# Patient Record
Sex: Female | Born: 1955
Health system: Southern US, Community
[De-identification: ages and names within clinical notes are randomized; demographics above are authoritative.]

## PROBLEM LIST (undated history)

## (undated) DIAGNOSIS — E7800 Pure hypercholesterolemia, unspecified: Secondary | ICD-10-CM

## (undated) DIAGNOSIS — I10 Essential (primary) hypertension: Secondary | ICD-10-CM

## (undated) HISTORY — PX: CHOLECYSTECTOMY: SHX55

## (undated) HISTORY — PX: ROTATOR CUFF REPAIR: SHX139

## (undated) HISTORY — PX: EYE SURGERY: SHX253

## (undated) HISTORY — PX: TONSILLECTOMY: SUR1361

## (undated) HISTORY — DX: Pure hypercholesterolemia, unspecified: E78.00

## (undated) HISTORY — DX: Essential (primary) hypertension: I10

## (undated) HISTORY — PX: BREAST SURGERY: SHX581

---

## 1999-02-14 ENCOUNTER — Encounter: Payer: Self-pay | Admitting: Gynecology

## 1999-02-14 ENCOUNTER — Encounter: Admission: RE | Admit: 1999-02-14 | Discharge: 1999-02-14 | Payer: Self-pay | Admitting: Gynecology

## 2000-04-03 ENCOUNTER — Other Ambulatory Visit: Admission: RE | Admit: 2000-04-03 | Discharge: 2000-04-03 | Payer: Self-pay | Admitting: Gynecology

## 2000-04-04 ENCOUNTER — Encounter: Payer: Self-pay | Admitting: Gynecology

## 2000-04-04 ENCOUNTER — Encounter: Admission: RE | Admit: 2000-04-04 | Discharge: 2000-04-04 | Payer: Self-pay | Admitting: Gynecology

## 2000-12-10 ENCOUNTER — Ambulatory Visit (HOSPITAL_COMMUNITY): Admission: RE | Admit: 2000-12-10 | Discharge: 2000-12-10 | Payer: Self-pay | Admitting: Orthopedic Surgery

## 2000-12-14 ENCOUNTER — Ambulatory Visit (HOSPITAL_COMMUNITY): Admission: RE | Admit: 2000-12-14 | Discharge: 2000-12-14 | Payer: Self-pay | Admitting: Orthopedic Surgery

## 2000-12-14 ENCOUNTER — Encounter: Payer: Self-pay | Admitting: Orthopedic Surgery

## 2001-04-23 ENCOUNTER — Encounter: Payer: Self-pay | Admitting: Gynecology

## 2001-04-23 ENCOUNTER — Encounter: Admission: RE | Admit: 2001-04-23 | Discharge: 2001-04-23 | Payer: Self-pay | Admitting: Gynecology

## 2001-04-24 ENCOUNTER — Other Ambulatory Visit: Admission: RE | Admit: 2001-04-24 | Discharge: 2001-04-24 | Payer: Self-pay | Admitting: Gynecology

## 2002-04-24 ENCOUNTER — Encounter: Payer: Self-pay | Admitting: Gynecology

## 2002-04-24 ENCOUNTER — Encounter: Admission: RE | Admit: 2002-04-24 | Discharge: 2002-04-24 | Payer: Self-pay | Admitting: Gynecology

## 2002-05-05 ENCOUNTER — Other Ambulatory Visit: Admission: RE | Admit: 2002-05-05 | Discharge: 2002-05-05 | Payer: Self-pay | Admitting: Gynecology

## 2003-06-12 ENCOUNTER — Encounter: Admission: RE | Admit: 2003-06-12 | Discharge: 2003-06-12 | Payer: Self-pay | Admitting: Gynecology

## 2003-10-02 ENCOUNTER — Emergency Department (HOSPITAL_COMMUNITY): Admission: EM | Admit: 2003-10-02 | Discharge: 2003-10-02 | Payer: Self-pay | Admitting: Family Medicine

## 2004-03-18 ENCOUNTER — Ambulatory Visit (HOSPITAL_COMMUNITY): Admission: RE | Admit: 2004-03-18 | Discharge: 2004-03-18 | Payer: Self-pay | Admitting: *Deleted

## 2005-06-20 ENCOUNTER — Encounter: Admission: RE | Admit: 2005-06-20 | Discharge: 2005-06-20 | Payer: Self-pay | Admitting: Gynecology

## 2005-06-21 ENCOUNTER — Other Ambulatory Visit: Admission: RE | Admit: 2005-06-21 | Discharge: 2005-06-21 | Payer: Self-pay | Admitting: Gynecology

## 2006-08-28 ENCOUNTER — Other Ambulatory Visit: Admission: RE | Admit: 2006-08-28 | Discharge: 2006-08-28 | Payer: Self-pay | Admitting: Gynecology

## 2006-09-05 ENCOUNTER — Encounter: Admission: RE | Admit: 2006-09-05 | Discharge: 2006-09-05 | Payer: Self-pay | Admitting: Gynecology

## 2007-12-03 ENCOUNTER — Encounter: Admission: RE | Admit: 2007-12-03 | Discharge: 2007-12-03 | Payer: Self-pay | Admitting: Gynecology

## 2009-01-26 ENCOUNTER — Encounter: Admission: RE | Admit: 2009-01-26 | Discharge: 2009-01-26 | Payer: Self-pay | Admitting: Gynecology

## 2010-04-01 ENCOUNTER — Other Ambulatory Visit: Payer: Self-pay | Admitting: Gynecology

## 2010-04-01 DIAGNOSIS — Z1239 Encounter for other screening for malignant neoplasm of breast: Secondary | ICD-10-CM

## 2010-04-25 ENCOUNTER — Ambulatory Visit
Admission: RE | Admit: 2010-04-25 | Discharge: 2010-04-25 | Disposition: A | Discharge status: Home / Self Care | Payer: Managed Care, Other (non HMO) | Source: Ambulatory Visit | Attending: Gynecology | Admitting: Gynecology

## 2010-04-25 DIAGNOSIS — Z1239 Encounter for other screening for malignant neoplasm of breast: Secondary | ICD-10-CM

## 2011-05-18 ENCOUNTER — Other Ambulatory Visit: Payer: Self-pay | Admitting: Gynecology

## 2011-05-18 DIAGNOSIS — Z1231 Encounter for screening mammogram for malignant neoplasm of breast: Secondary | ICD-10-CM

## 2011-05-30 ENCOUNTER — Ambulatory Visit
Admission: RE | Admit: 2011-05-30 | Discharge: 2011-05-30 | Disposition: A | Discharge status: Home / Self Care | Payer: 59 | Source: Ambulatory Visit | Attending: Gynecology | Admitting: Gynecology

## 2011-05-30 DIAGNOSIS — Z1231 Encounter for screening mammogram for malignant neoplasm of breast: Secondary | ICD-10-CM

## 2011-06-01 ENCOUNTER — Other Ambulatory Visit: Payer: Self-pay | Admitting: Gynecology

## 2011-06-01 DIAGNOSIS — R928 Other abnormal and inconclusive findings on diagnostic imaging of breast: Secondary | ICD-10-CM

## 2011-06-06 ENCOUNTER — Ambulatory Visit
Admission: RE | Admit: 2011-06-06 | Discharge: 2011-06-06 | Disposition: A | Discharge status: Home / Self Care | Payer: 59 | Source: Ambulatory Visit | Attending: Gynecology | Admitting: Gynecology

## 2011-06-06 ENCOUNTER — Other Ambulatory Visit: Payer: Self-pay | Admitting: Gynecology

## 2011-06-06 DIAGNOSIS — R928 Other abnormal and inconclusive findings on diagnostic imaging of breast: Secondary | ICD-10-CM

## 2011-06-12 ENCOUNTER — Ambulatory Visit
Admission: RE | Admit: 2011-06-12 | Discharge: 2011-06-12 | Disposition: A | Discharge status: Home / Self Care | Payer: 59 | Source: Ambulatory Visit | Attending: Gynecology | Admitting: Gynecology

## 2011-06-12 ENCOUNTER — Other Ambulatory Visit: Payer: Self-pay | Admitting: Gynecology

## 2011-06-12 DIAGNOSIS — R928 Other abnormal and inconclusive findings on diagnostic imaging of breast: Secondary | ICD-10-CM

## 2011-06-12 HISTORY — PX: BREAST BIOPSY: SHX20

## 2011-06-12 HISTORY — PX: BREAST CYST ASPIRATION: SHX578

## 2011-11-10 ENCOUNTER — Other Ambulatory Visit: Payer: Self-pay | Admitting: Gynecology

## 2011-11-10 DIAGNOSIS — N6320 Unspecified lump in the left breast, unspecified quadrant: Secondary | ICD-10-CM

## 2011-12-18 ENCOUNTER — Ambulatory Visit
Admission: RE | Admit: 2011-12-18 | Discharge: 2011-12-18 | Disposition: A | Discharge status: Home / Self Care | Payer: 59 | Source: Ambulatory Visit | Attending: Gynecology | Admitting: Gynecology

## 2011-12-18 DIAGNOSIS — N6320 Unspecified lump in the left breast, unspecified quadrant: Secondary | ICD-10-CM

## 2012-06-25 ENCOUNTER — Other Ambulatory Visit: Payer: Self-pay

## 2012-06-25 DIAGNOSIS — Z1231 Encounter for screening mammogram for malignant neoplasm of breast: Secondary | ICD-10-CM

## 2012-06-27 ENCOUNTER — Ambulatory Visit
Admission: RE | Admit: 2012-06-27 | Discharge: 2012-06-27 | Disposition: A | Discharge status: Home / Self Care | Payer: 59 | Source: Ambulatory Visit

## 2012-06-27 DIAGNOSIS — Z1231 Encounter for screening mammogram for malignant neoplasm of breast: Secondary | ICD-10-CM

## 2012-10-07 ENCOUNTER — Ambulatory Visit (INDEPENDENT_AMBULATORY_CARE_PROVIDER_SITE_OTHER): Payer: 59 | Admitting: Gynecology

## 2012-10-07 ENCOUNTER — Encounter: Payer: Self-pay | Admitting: Gynecology

## 2012-10-07 VITALS — BP 140/84 | Ht 65.0 in | Wt 185.0 lb

## 2012-10-07 DIAGNOSIS — Z01419 Encounter for gynecological examination (general) (routine) without abnormal findings: Secondary | ICD-10-CM

## 2012-10-07 DIAGNOSIS — Z79890 Hormone replacement therapy (postmenopausal): Secondary | ICD-10-CM

## 2012-10-07 MED ORDER — PROGESTERONE MICRONIZED 100 MG PO CAPS: 100.0000 mg | ORAL_CAPSULE | Freq: Every day | ORAL | Status: DC

## 2012-10-07 MED ORDER — ESTRADIOL 0.05 MG/24HR TD PTTW: 1.0000 | MEDICATED_PATCH | TRANSDERMAL | Status: DC

## 2012-10-07 NOTE — Progress Notes (Addendum)
Daisy Campbell Apr 13, 1955 [id number]        57 y.o.  [id number] new patient for annual exam.  Former patient of Dr. Nicholas Lose. Several issues noted below.  Past medical history,surgical history, medications, allergies, family history and social history were all reviewed and documented in the EPIC chart.  ROS:  Performed and pertinent positives and negatives are included in the history, assessment and plan .  Exam: [facility] assistant Filed Vitals:   10/07/12 1004  BP: 140/84  Height: 5\' 5"  (1.651 m)  Weight: 185 lb (83.915 kg)   General appearance  Normal Skin grossly normal Head/Neck normal with no cervical or supraclavicular adenopathy thyroid normal Lungs  clear Cardiac RR, without RMG Abdominal  soft, nontender, without masses, organomegaly or hernia Breasts  examined lying and sitting without masses, retractions, discharge or axillary adenopathy. Pelvic  Ext/BUS/vagina  normal with small sebaceous cyst right posterior fourchette hymenal ring area  Cervix  normal   Uterus  anteverted, normal size, shape and contour, midline and mobile nontender   Adnexa  Without masses or tenderness    Anus and perineum  normal   Rectovaginal  normal sphincter tone without palpated masses or tenderness.    Assessment/Plan:  57 y.o. [name] female for annual exam.   1. HRT. Patient on Minivelle 0.5 mg patches and Prometrium 200 mg every 3 months for 12 days. Is having a withdrawal bleed at the end of the 12 days. Started due mostly to emotional swings and has done well since initiation.  I reviewed the whole issue of HRT with her to include the WHI study with increased risk of stroke, heart attack, DVT and breast cancer. The ACOG and NAMS statements for lowest dose for the shortest period of time reviewed. Transdermal versus oral first-pass effect benefit discussed. Patient wants to continue. I discussed switching to Prometrium 100 mg nightly to avoid withdrawal bleeding and remembering to take the  progesterone. Patient agrees with this. I refilled both times one year. Patient knows to report any bleeding. 2. Small subcutaneous nodule right lateral posterior fourchette. Consistent with small sebaceous cyst. Patient notes been present on and off for years and is not bothersome to her. She will continue to observe. If enlarges or changes she knows to represent for evaluation. 3. History of hypertension and hypercholesterolemia. Being treated by Dr. Nicholas Lose. She actually is setting up an appointment with a primary care to follow her for these and she will followup with them. Mild elevated blood pressure today noted to her. 4. Pap smear 2013. No Pap smear done today. No history of abnormal Pap smears previously. Reviewed current screening guidelines and plan repeat at 3 year interval. 5. Mammography 06/2012. Continue with annual mammography. SBE monthly reviewed. 6. Colonoscopy 7 years ago. Planned repeat at 10 year interval. 7. DEXA 2012 reported normal. Will obtain copy. Recommend repeat at 5 year interval. Increase calcium vitamin D reviewed. 8. Health maintenance. Follow up with primary as noted above. Followup here in one year, sooner as needed.  Note: This document was prepared with digital dictation and possible smart phrase technology. Any transcriptional errors that result from this process are unintentional.   [name] MD, 10:54 AM 10/07/2012

## 2012-10-07 NOTE — Patient Instructions (Signed)
Continue to use the estrogen patch. Start on the progesterone pills nightly. Call if any bleeding or other issues.

## 2012-10-08 LAB — URINALYSIS W MICROSCOPIC + REFLEX CULTURE
Bacteria, UA: NONE SEEN
Casts: NONE SEEN
Crystals: NONE SEEN
Nitrite: NEGATIVE
Specific Gravity, Urine: 1.008 (ref 1.005–1.030)
Squamous Epithelial / LPF: NONE SEEN
Urobilinogen, UA: 0.2 mg/dL (ref 0.0–1.0)

## 2012-10-30 ENCOUNTER — Encounter: Payer: Self-pay | Admitting: Gynecology

## 2013-08-12 ENCOUNTER — Other Ambulatory Visit: Payer: Self-pay

## 2013-08-12 DIAGNOSIS — Z1231 Encounter for screening mammogram for malignant neoplasm of breast: Secondary | ICD-10-CM

## 2013-08-14 ENCOUNTER — Ambulatory Visit
Admission: RE | Admit: 2013-08-14 | Discharge: 2013-08-14 | Disposition: A | Discharge status: Home / Self Care | Payer: 59 | Source: Ambulatory Visit

## 2013-08-14 ENCOUNTER — Encounter (INDEPENDENT_AMBULATORY_CARE_PROVIDER_SITE_OTHER): Payer: Self-pay

## 2013-08-14 DIAGNOSIS — Z1231 Encounter for screening mammogram for malignant neoplasm of breast: Secondary | ICD-10-CM

## 2013-10-03 ENCOUNTER — Other Ambulatory Visit: Payer: Self-pay | Admitting: Gynecology

## 2013-10-29 ENCOUNTER — Other Ambulatory Visit: Payer: Self-pay | Admitting: Gynecology

## 2013-11-17 ENCOUNTER — Ambulatory Visit (INDEPENDENT_AMBULATORY_CARE_PROVIDER_SITE_OTHER): Payer: 59 | Admitting: Gynecology

## 2013-11-17 ENCOUNTER — Encounter: Payer: Self-pay | Admitting: Gynecology

## 2013-11-17 VITALS — BP 130/80 | Ht 65.0 in | Wt 199.0 lb

## 2013-11-17 DIAGNOSIS — Z79890 Hormone replacement therapy (postmenopausal): Secondary | ICD-10-CM

## 2013-11-17 DIAGNOSIS — Z01419 Encounter for gynecological examination (general) (routine) without abnormal findings: Secondary | ICD-10-CM

## 2013-11-17 DIAGNOSIS — N952 Postmenopausal atrophic vaginitis: Secondary | ICD-10-CM

## 2013-11-17 MED ORDER — PROGESTERONE MICRONIZED 100 MG PO CAPS: ORAL_CAPSULE | ORAL | Status: DC

## 2013-11-17 MED ORDER — ESTRADIOL 0.05 MG/24HR TD PTTW: MEDICATED_PATCH | TRANSDERMAL | Status: DC

## 2013-11-17 NOTE — Patient Instructions (Signed)
You may obtain a copy of any labs that were done today by logging onto MyChart as outlined in the instructions provided with your AVS (after visit summary). The office will not call with normal lab results but certainly if there are any significant abnormalities then we will contact you.   Health Maintenance, Female A healthy lifestyle and preventative care can promote health and wellness.  Maintain regular health, dental, and eye exams.  Eat a healthy diet. Foods like vegetables, fruits, whole grains, low-fat dairy products, and lean protein foods contain the nutrients you need without too many calories. Decrease your intake of foods high in solid fats, added sugars, and salt. Get information about a proper diet from your caregiver, if necessary.  Regular physical exercise is one of the most important things you can do for your health. Most adults should get at least 150 minutes of moderate-intensity exercise (any activity that increases your heart rate and causes you to sweat) each week. In addition, most adults need muscle-strengthening exercises on 2 or more days a week.   Maintain a healthy weight. The body mass index (BMI) is a screening tool to identify possible weight problems. It provides an estimate of body fat based on height and weight. Your caregiver can help determine your BMI, and can help you achieve or maintain a healthy weight. For adults 20 years and older:  A BMI below 18.5 is considered underweight.  A BMI of 18.5 to 24.9 is normal.  A BMI of 25 to 29.9 is considered overweight.  A BMI of 30 and above is considered obese.  Maintain normal blood lipids and cholesterol by exercising and minimizing your intake of saturated fat. Eat a balanced diet with plenty of fruits and vegetables. Blood tests for lipids and cholesterol should begin at age 61 and be repeated every 5 years. If your lipid or cholesterol levels are high, you are over 50, or you are a high risk for heart  disease, you may need your cholesterol levels checked more frequently.Ongoing high lipid and cholesterol levels should be treated with medicines if diet and exercise are not effective.  If you smoke, find out from your caregiver how to quit. If you do not use tobacco, do not start.  Lung cancer screening is recommended for adults aged 33 80 years who are at high risk for developing lung cancer because of a history of smoking. Yearly low-dose computed tomography (CT) is recommended for people who have at least a 30-pack-year history of smoking and are a current smoker or have quit within the past 15 years. A pack year of smoking is smoking an average of 1 pack of cigarettes a day for 1 year (for example: 1 pack a day for 30 years or 2 packs a day for 15 years). Yearly screening should continue until the smoker has stopped smoking for at least 15 years. Yearly screening should also be stopped for people who develop a health problem that would prevent them from having lung cancer treatment.  If you are pregnant, do not drink alcohol. If you are breastfeeding, be very cautious about drinking alcohol. If you are not pregnant and choose to drink alcohol, do not exceed 1 drink per day. One drink is considered to be 12 ounces (355 mL) of beer, 5 ounces (148 mL) of wine, or 1.5 ounces (44 mL) of liquor.  Avoid use of street drugs. Do not share needles with anyone. Ask for help if you need support or instructions about stopping  the use of drugs.  High blood pressure causes heart disease and increases the risk of stroke. Blood pressure should be checked at least every 1 to 2 years. Ongoing high blood pressure should be treated with medicines, if weight loss and exercise are not effective.  If you are 59 to 60 years old, ask your caregiver if you should take aspirin to prevent strokes.  Diabetes screening involves taking a blood sample to check your fasting blood sugar level. This should be done once every 3  years, after age 91, if you are within normal weight and without risk factors for diabetes. Testing should be considered at a younger age or be carried out more frequently if you are overweight and have at least 1 risk factor for diabetes.  Breast cancer screening is essential preventative care for women. You should practice "breast self-awareness." This means understanding the normal appearance and feel of your breasts and may include breast self-examination. Any changes detected, no matter how small, should be reported to a caregiver. Women in their 66s and 30s should have a clinical breast exam (CBE) by a caregiver as part of a regular health exam every 1 to 3 years. After age 101, women should have a CBE every year. Starting at age 100, women should consider having a mammogram (breast X-ray) every year. Women who have a family history of breast cancer should talk to their caregiver about genetic screening. Women at a high risk of breast cancer should talk to their caregiver about having an MRI and a mammogram every year.  Breast cancer gene (BRCA)-related cancer risk assessment is recommended for women who have family members with BRCA-related cancers. BRCA-related cancers include breast, ovarian, tubal, and peritoneal cancers. Having family members with these cancers may be associated with an increased risk for harmful changes (mutations) in the breast cancer genes BRCA1 and BRCA2. Results of the assessment will determine the need for genetic counseling and BRCA1 and BRCA2 testing.  The Pap test is a screening test for cervical cancer. Women should have a Pap test starting at age 57. Between ages 25 and 35, Pap tests should be repeated every 2 years. Beginning at age 37, you should have a Pap test every 3 years as long as the past 3 Pap tests have been normal. If you had a hysterectomy for a problem that was not cancer or a condition that could lead to cancer, then you no longer need Pap tests. If you are  between ages 50 and 76, and you have had normal Pap tests going back 10 years, you no longer need Pap tests. If you have had past treatment for cervical cancer or a condition that could lead to cancer, you need Pap tests and screening for cancer for at least 20 years after your treatment. If Pap tests have been discontinued, risk factors (such as a new sexual partner) need to be reassessed to determine if screening should be resumed. Some women have medical problems that increase the chance of getting cervical cancer. In these cases, your caregiver may recommend more frequent screening and Pap tests.  The human papillomavirus (HPV) test is an additional test that may be used for cervical cancer screening. The HPV test looks for the virus that can cause the cell changes on the cervix. The cells collected during the Pap test can be tested for HPV. The HPV test could be used to screen women aged 44 years and older, and should be used in women of any age  who have unclear Pap test results. After the age of 55, women should have HPV testing at the same frequency as a Pap test.  Colorectal cancer can be detected and often prevented. Most routine colorectal cancer screening begins at the age of 44 and continues through age 20. However, your caregiver may recommend screening at an earlier age if you have risk factors for colon cancer. On a yearly basis, your caregiver may provide home test kits to check for hidden blood in the stool. Use of a small camera at the end of a tube, to directly examine the colon (sigmoidoscopy or colonoscopy), can detect the earliest forms of colorectal cancer. Talk to your caregiver about this at age 86, when routine screening begins. Direct examination of the colon should be repeated every 5 to 10 years through age 13, unless early forms of pre-cancerous polyps or small growths are found.  Hepatitis C blood testing is recommended for all people born from [birth year] through 1967 and any  individual with known risks for hepatitis C.  Practice safe sex. Use condoms and avoid high-risk sexual practices to reduce the spread of sexually transmitted infections (STIs). Sexually active women aged 36 and younger should be checked for Chlamydia, which is a common sexually transmitted infection. Older women with new or multiple partners should also be tested for Chlamydia. Testing for other STIs is recommended if you are sexually active and at increased risk.  Osteoporosis is a disease in which the bones lose minerals and strength with aging. This can result in serious bone fractures. The risk of osteoporosis can be identified using a bone density scan. Women ages 20 and over and women at risk for fractures or osteoporosis should discuss screening with their caregivers. Ask your caregiver whether you should be taking a calcium supplement or vitamin D to reduce the rate of osteoporosis.  Menopause can be associated with physical symptoms and risks. Hormone replacement therapy is available to decrease symptoms and risks. You should talk to your caregiver about whether hormone replacement therapy is right for you.  Use sunscreen. Apply sunscreen liberally and repeatedly throughout the day. You should seek shade when your shadow is shorter than you. Protect yourself by wearing long sleeves, pants, a wide-brimmed hat, and sunglasses year round, whenever you are outdoors.  Notify your caregiver of new moles or changes in moles, especially if there is a change in shape or color. Also notify your caregiver if a mole is larger than the size of a pencil eraser.  Stay current with your immunizations. Document Released: 09/05/2010 Document Revised: 06/17/2012 Document Reviewed: 09/05/2010 [facility] Patient Information 2014 [employer].

## 2013-11-17 NOTE — Progress Notes (Signed)
Daisy Campbell 05-10-1955 [id number]        58 y.o.  [id number] for annual exam.  Several issues noted below.  Past medical history,surgical history, problem list, medications, allergies, family history and social history were all reviewed and documented as reviewed in the EPIC chart.  ROS:  12 system ROS performed with pertinent positives and negatives included in the history, assessment and plan.   Additional significant findings :  none   Exam: [facility] person Vitals:   11/17/13 1131  BP: 130/80  Height:  (1.651 m)  Weight: 199 lb (90.266 kg)   General appearance:  Normal affect, orientation and appearance. Skin: Grossly normal HEENT: Without gross lesions.  No cervical or supraclavicular adenopathy. Thyroid normal.  Lungs:  Clear without wheezing, rales or rhonchi Cardiac: RR, without RMG Abdominal:  Soft, nontender, without masses, guarding, rebound, organomegaly or hernia Breasts:  Examined lying and sitting without masses, retractions, discharge or axillary adenopathy. Pelvic:  Ext/BUS/vagina with generalized atrophic changes  Cervix with atrophic changes  Uterus  anteverted, normal size, shape and contour, midline and mobile nontender   Adnexa  Without masses or tenderness    Anus and perineum  Normal   Rectovaginal  Normal sphincter tone without palpated masses or tenderness.    Assessment/Plan:  58 y.o. [id number] female for annual exam.   1. Postmenopausal/HRT. Patient continues on minivelle 0.05 mg patch and Prometrium 100 mg nightly. Has done well since switching over from the Prometrium 200 mg for 12 days every 3 months per Dr. Nicholas Lose. Not having any bleeding.  I again reviewed the whole issue of HRT with her to include the WHI study with increased risk of stroke, heart attack, DVT and breast cancer. The ACOG and NAMS statements for lowest dose for the shortest period of time reviewed. When to stop them patients currently taking HRT discussed. At this point patient's  comfortable with continuing feeling well and I refilled her times a year. She will decide if she wants to try to wean this coming winter. Otherwise she will continue through next year. Report any vaginal bleeding. 2. Pap smear 2013. No Pap smear done today. No history of abnormal Pap smears noted. Plan repeat Pap smear next year at three-year interval per current screening guidelines. 3. DEXA 2013 normal. Repeat at 5 year interval or possibly waiting until age 49. Increased calcium vitamin D reviewed. 4. Mammography 08/2013. Continue with annual mammography. SBE monthly reviewed. 5. Colonoscopy approximately 8 years ago. Patient is going to call to verify when it was done and went is recommended to repeated. 6. Health maintenance. No routine blood work done as she's having this done through her primary physician's office is following her for her medical issues. Follow up one year, sooner as needed.   Note: This document was prepared with digital dictation and possible smart phrase technology. Any transcriptional errors that result from this process are unintentional.   [name] MD, 11:54 AM 11/17/2013

## 2013-11-18 LAB — URINALYSIS W MICROSCOPIC + REFLEX CULTURE
Bilirubin Urine: NEGATIVE
CASTS: NONE SEEN
Crystals: NONE SEEN
GLUCOSE, UA: NEGATIVE mg/dL
Hgb urine dipstick: NEGATIVE
Ketones, ur: NEGATIVE mg/dL
LEUKOCYTES UA: NEGATIVE
Nitrite: NEGATIVE
PH: 6.5 (ref 5.0–8.0)
Protein, ur: NEGATIVE mg/dL
SPECIFIC GRAVITY, URINE: 1.012 (ref 1.005–1.030)
Urobilinogen, UA: 0.2 mg/dL (ref 0.0–1.0)

## 2013-11-20 ENCOUNTER — Other Ambulatory Visit: Payer: Self-pay | Admitting: Gynecology

## 2013-11-20 MED ORDER — SULFAMETHOXAZOLE-TMP DS 800-160 MG PO TABS: 1.0000 | ORAL_TABLET | Freq: Two times a day (BID) | ORAL | Status: DC

## 2013-11-21 LAB — URINE CULTURE

## 2014-01-05 ENCOUNTER — Encounter: Payer: Self-pay | Admitting: Gynecology

## 2014-10-02 ENCOUNTER — Other Ambulatory Visit: Payer: Self-pay

## 2014-10-02 DIAGNOSIS — Z1231 Encounter for screening mammogram for malignant neoplasm of breast: Secondary | ICD-10-CM

## 2014-10-06 ENCOUNTER — Ambulatory Visit
Admission: RE | Admit: 2014-10-06 | Discharge: 2014-10-06 | Disposition: A | Discharge status: Home / Self Care | Payer: 59 | Source: Ambulatory Visit

## 2014-10-06 DIAGNOSIS — Z1231 Encounter for screening mammogram for malignant neoplasm of breast: Secondary | ICD-10-CM

## 2014-11-19 ENCOUNTER — Ambulatory Visit (INDEPENDENT_AMBULATORY_CARE_PROVIDER_SITE_OTHER): Payer: 59 | Admitting: Gynecology

## 2014-11-19 ENCOUNTER — Encounter: Payer: Self-pay | Admitting: Gynecology

## 2014-11-19 ENCOUNTER — Other Ambulatory Visit (HOSPITAL_COMMUNITY)
Admission: RE | Admit: 2014-11-19 | Discharge: 2014-11-19 | Disposition: A | Discharge status: Home / Self Care | Payer: 59 | Source: Ambulatory Visit | Attending: Gynecology | Admitting: Gynecology

## 2014-11-19 VITALS — BP 126/80 | Ht 65.0 in | Wt 187.0 lb

## 2014-11-19 DIAGNOSIS — Z01419 Encounter for gynecological examination (general) (routine) without abnormal findings: Secondary | ICD-10-CM | POA: Insufficient documentation

## 2014-11-19 DIAGNOSIS — N952 Postmenopausal atrophic vaginitis: Secondary | ICD-10-CM

## 2014-11-19 DIAGNOSIS — Z79890 Hormone replacement therapy (postmenopausal): Secondary | ICD-10-CM | POA: Diagnosis not present

## 2014-11-19 MED ORDER — PROGESTERONE MICRONIZED 100 MG PO CAPS: ORAL_CAPSULE | ORAL | Status: DC

## 2014-11-19 MED ORDER — ESTRADIOL 0.05 MG/24HR TD PTTW: MEDICATED_PATCH | TRANSDERMAL | Status: DC

## 2014-11-19 NOTE — Addendum Note (Signed)
Addended by: Dayna Barker on: 11/19/2014 10:52 AM   Modules accepted: Orders

## 2014-11-19 NOTE — Patient Instructions (Signed)

## 2014-11-19 NOTE — Progress Notes (Signed)
Daisy Campbell Mar 29, 1955 [id number]        59 y.o.  [id number] for annual exam.  Doing well without complaints  Past medical history,surgical history, problem list, medications, allergies, family history and social history were all reviewed and documented as reviewed in the EPIC chart.  ROS:  Performed with pertinent positives and negatives included in the history, assessment and plan.   Additional significant findings :  none   Exam: [facility] person Vitals:   11/19/14 0954  BP: 126/80  Height:  (1.651 m)  Weight: 187 lb (84.823 kg)   General appearance:  Normal affect, orientation and appearance. Skin: Grossly normal HEENT: Without gross lesions.  No cervical or supraclavicular adenopathy. Thyroid normal.  Lungs:  Clear without wheezing, rales or rhonchi Cardiac: RR, without RMG Abdominal:  Soft, nontender, without masses, guarding, rebound, organomegaly or hernia Breasts:  Examined lying and sitting without masses, retractions, discharge or axillary adenopathy. Pelvic:  Ext/BUS/vagina with atrophic changes  Cervix with atrophic changes. Pap smear done  Uterus anteverted, normal size, shape and contour, midline and mobile nontender   Adnexa  Without masses or tenderness    Anus and perineum  Normal   Rectovaginal  Normal sphincter tone without palpated masses or tenderness.    Assessment/Plan:  59 y.o. [id number] female for annual exam.   1. Postmenopausal/atrophic genital changes/HRT. Patient continues on minivelle 0.05 mg patch and Prometrium 100 mg nightly. Doing well without hot flashes, night sweats, vaginal dryness or vaginal bleeding. I again reviewed the whole issue of HRT, WHI study, increased risk of stroke heart attack DVT and breast cancer. At this point patient wants to continue understanding the risks and I refilled her 1 year. Call if any vaginal bleeding. 2. Mammography 10/2014. Continue with annual mammography. SBE monthly reviewed. 3. Pap smear 2013. Pap  smear done today. No history of abnormal Pap smears previously. 4. DEXA 2013 normal. Recommended repeat at age 39. Increased calcium vitamin D reviewed. 5. Colonoscopy coming due this year and she knows to call and schedule this. 6. Health maintenance. No routine blood work done as patient has this done at her primary physician's office. Follow up 1 year, sooner as needed.   [name] MD, 10:16 AM 11/19/2014

## 2014-11-20 ENCOUNTER — Other Ambulatory Visit: Payer: Self-pay | Admitting: Gynecology

## 2014-11-20 LAB — CYTOLOGY - PAP

## 2014-12-09 ENCOUNTER — Other Ambulatory Visit: Payer: Self-pay | Admitting: Gynecology

## 2015-10-22 ENCOUNTER — Other Ambulatory Visit: Payer: Self-pay | Admitting: Gynecology

## 2015-10-22 DIAGNOSIS — Z1231 Encounter for screening mammogram for malignant neoplasm of breast: Secondary | ICD-10-CM

## 2015-11-10 ENCOUNTER — Ambulatory Visit
Admission: RE | Admit: 2015-11-10 | Discharge: 2015-11-10 | Disposition: A | Discharge status: Home / Self Care | Payer: PRIVATE HEALTH INSURANCE | Source: Ambulatory Visit | Attending: Gynecology | Admitting: Gynecology

## 2015-11-10 DIAGNOSIS — Z1231 Encounter for screening mammogram for malignant neoplasm of breast: Secondary | ICD-10-CM

## 2015-11-22 ENCOUNTER — Ambulatory Visit (INDEPENDENT_AMBULATORY_CARE_PROVIDER_SITE_OTHER): Payer: PRIVATE HEALTH INSURANCE | Admitting: Gynecology

## 2015-11-22 ENCOUNTER — Encounter: Payer: Self-pay | Admitting: Gynecology

## 2015-11-22 VITALS — BP 120/76 | Ht 64.0 in | Wt 188.0 lb

## 2015-11-22 DIAGNOSIS — Z01419 Encounter for gynecological examination (general) (routine) without abnormal findings: Secondary | ICD-10-CM

## 2015-11-22 DIAGNOSIS — Z79890 Hormone replacement therapy (postmenopausal): Secondary | ICD-10-CM | POA: Diagnosis not present

## 2015-11-22 DIAGNOSIS — N952 Postmenopausal atrophic vaginitis: Secondary | ICD-10-CM

## 2015-11-22 MED ORDER — PROGESTERONE MICRONIZED 100 MG PO CAPS: ORAL_CAPSULE | ORAL | 4 refills | Status: DC

## 2015-11-22 MED ORDER — ESTRADIOL 0.05 MG/24HR TD PTWK: 0.0500 mg | MEDICATED_PATCH | TRANSDERMAL | 12 refills | Status: DC

## 2015-11-22 NOTE — Patient Instructions (Signed)
Schedule your colonoscopy  You may obtain a copy of any labs that were done today by logging onto MyChart as outlined in the instructions provided with your AVS (after visit summary). The office will not call with normal lab results but certainly if there are any significant abnormalities then we will contact you.   Health Maintenance Adopting a healthy lifestyle and getting preventive care can go a long way to promote health and wellness. Talk with your health care provider about what schedule of regular examinations is right for you. This is a good chance for you to check in with your provider about disease prevention and staying healthy. In between checkups, there are plenty of things you can do on your own. Experts have done a lot of research about which lifestyle changes and preventive measures are most likely to keep you healthy. Ask your health care provider for more information. WEIGHT AND DIET  Eat a healthy diet  Be sure to include plenty of vegetables, fruits, low-fat dairy products, and lean protein.  Do not eat a lot of foods high in solid fats, added sugars, or salt.  Get regular exercise. This is one of the most important things you can do for your health.  Most adults should exercise for at least 150 minutes each week. The exercise should increase your heart rate and make you sweat (moderate-intensity exercise).  Most adults should also do strengthening exercises at least twice a week. This is in addition to the moderate-intensity exercise.  Maintain a healthy weight  Body mass index (BMI) is a measurement that can be used to identify possible weight problems. It estimates body fat based on height and weight. Your health care provider can help determine your BMI and help you achieve or maintain a healthy weight.  For females 70 years of age and older:   A BMI below 18.5 is considered underweight.  A BMI of 18.5 to 24.9 is normal.  A BMI of 25 to 29.9 is considered  overweight.  A BMI of 30 and above is considered obese.  Watch levels of cholesterol and blood lipids  You should start having your blood tested for lipids and cholesterol at 62 years of age, then have this test every 5 years.  You may need to have your cholesterol levels checked more often if:  Your lipid or cholesterol levels are high.  You are older than 62 years of age.  You are at high risk for heart disease.  CANCER SCREENING   Lung Cancer  Lung cancer screening is recommended for adults 51-55 years old who are at high risk for lung cancer because of a history of smoking.  A yearly low-dose CT scan of the lungs is recommended for people who:  Currently smoke.  Have quit within the past 15 years.  Have at least a 30-pack-year history of smoking. A pack year is smoking an average of one pack of cigarettes a day for 1 year.  Yearly screening should continue until it has been 15 years since you quit.  Yearly screening should stop if you develop a health problem that would prevent you from having lung cancer treatment.  Breast Cancer  Practice breast self-awareness. This means understanding how your breasts normally appear and feel.  It also means doing regular breast self-exams. Let your health care provider know about any changes, no matter how small.  If you are in your 20s or 30s, you should have a clinical breast exam (CBE) by a health  care provider every 1-3 years as part of a regular health exam.  If you are 76 or older, have a CBE every year. Also consider having a breast X-ray (mammogram) every year.  If you have a family history of breast cancer, talk to your health care provider about genetic screening.  If you are at high risk for breast cancer, talk to your health care provider about having an MRI and a mammogram every year.  Breast cancer gene (BRCA) assessment is recommended for women who have family members with BRCA-related cancers. BRCA-related  cancers include:  Breast.  Ovarian.  Tubal.  Peritoneal cancers.  Results of the assessment will determine the need for genetic counseling and BRCA1 and BRCA2 testing. Cervical Cancer Routine pelvic examinations to screen for cervical cancer are no longer recommended for nonpregnant women who are considered low risk for cancer of the pelvic organs (ovaries, uterus, and vagina) and who do not have symptoms. A pelvic examination may be necessary if you have symptoms including those associated with pelvic infections. Ask your health care provider if a screening pelvic exam is right for you.   The Pap test is the screening test for cervical cancer for women who are considered at risk.  If you had a hysterectomy for a problem that was not cancer or a condition that could lead to cancer, then you no longer need Pap tests.  If you are older than 65 years, and you have had normal Pap tests for the past 10 years, you no longer need to have Pap tests.  If you have had past treatment for cervical cancer or a condition that could lead to cancer, you need Pap tests and screening for cancer for at least 20 years after your treatment.  If you no longer get a Pap test, assess your risk factors if they change (such as having a new sexual partner). This can affect whether you should start being screened again.  Some women have medical problems that increase their chance of getting cervical cancer. If this is the case for you, your health care provider may recommend more frequent screening and Pap tests.  The human papillomavirus (HPV) test is another test that may be used for cervical cancer screening. The HPV test looks for the virus that can cause cell changes in the cervix. The cells collected during the Pap test can be tested for HPV.  The HPV test can be used to screen women 64 years of age and older. Getting tested for HPV can extend the interval between normal Pap tests from three to five  years.  An HPV test also should be used to screen women of any age who have unclear Pap test results.  After 62 years of age, women should have HPV testing as often as Pap tests.  Colorectal Cancer  This type of cancer can be detected and often prevented.  Routine colorectal cancer screening usually begins at 62 years of age and continues through 62 years of age.  Your health care provider may recommend screening at an earlier age if you have risk factors for colon cancer.  Your health care provider may also recommend using home test kits to check for hidden blood in the stool.  A small camera at the end of a tube can be used to examine your colon directly (sigmoidoscopy or colonoscopy). This is done to check for the earliest forms of colorectal cancer.  Routine screening usually begins at age 14.  Direct examination of the  colon should be repeated every 5-10 years through 62 years of age. However, you may need to be screened more often if early forms of precancerous polyps or small growths are found. Skin Cancer  Check your skin from head to toe regularly.  Tell your health care provider about any new moles or changes in moles, especially if there is a change in a mole's shape or color.  Also tell your health care provider if you have a mole that is larger than the size of a pencil eraser.  Always use sunscreen. Apply sunscreen liberally and repeatedly throughout the day.  Protect yourself by wearing long sleeves, pants, a wide-brimmed hat, and sunglasses whenever you are outside. HEART DISEASE, DIABETES, AND HIGH BLOOD PRESSURE   Have your blood pressure checked at least every 1-2 years. High blood pressure causes heart disease and increases the risk of stroke.  If you are between 57 years and 49 years old, ask your health care provider if you should take aspirin to prevent strokes.  Have regular diabetes screenings. This involves taking a blood sample to check your fasting  blood sugar level.  If you are at a normal weight and have a low risk for diabetes, have this test once every three years after 62 years of age.  If you are overweight and have a high risk for diabetes, consider being tested at a younger age or more often. PREVENTING INFECTION  Hepatitis B  If you have a higher risk for hepatitis B, you should be screened for this virus. You are considered at high risk for hepatitis B if:  You were born in a country where hepatitis B is common. Ask your health care provider which countries are considered high risk.  Your parents were born in a high-risk country, and you have not been immunized against hepatitis B (hepatitis B vaccine).  You have HIV or AIDS.  You use needles to inject street drugs.  You live with someone who has hepatitis B.  You have had sex with someone who has hepatitis B.  You get hemodialysis treatment.  You take certain medicines for conditions, including cancer, organ transplantation, and autoimmune conditions. Hepatitis C  Blood testing is recommended for:  Everyone born from [birth year] through 1967.  Anyone with known risk factors for hepatitis C. Sexually transmitted infections (STIs)  You should be screened for sexually transmitted infections (STIs) including gonorrhea and chlamydia if:  You are sexually active and are younger than 62 years of age.  You are older than 62 years of age and your health care provider tells you that you are at risk for this type of infection.  Your sexual activity has changed since you were last screened and you are at an increased risk for chlamydia or gonorrhea. Ask your health care provider if you are at risk.  If you do not have HIV, but are at risk, it may be recommended that you take a prescription medicine daily to prevent HIV infection. This is called pre-exposure prophylaxis (PrEP). You are considered at risk if:  You are sexually active and do not regularly use condoms or know  the HIV status of your partner(s).  You take drugs by injection.  You are sexually active with a partner who has HIV. Talk with your health care provider about whether you are at high risk of being infected with HIV. If you choose to begin PrEP, you should first be tested for HIV. You should then be tested every 3 months  for as long as you are taking PrEP.  PREGNANCY   If you are premenopausal and you may become pregnant, ask your health care provider about preconception counseling.  If you may become pregnant, take 400 to 800 micrograms (mcg) of folic acid every day.  If you want to prevent pregnancy, talk to your health care provider about birth control (contraception). OSTEOPOROSIS AND MENOPAUSE   Osteoporosis is a disease in which the bones lose minerals and strength with aging. This can result in serious bone fractures. Your risk for osteoporosis can be identified using a bone density scan.  If you are 65 years of age or older, or if you are at risk for osteoporosis and fractures, ask your health care provider if you should be screened.  Ask your health care provider whether you should take a calcium or vitamin D supplement to lower your risk for osteoporosis.  Menopause may have certain physical symptoms and risks.  Hormone replacement therapy may reduce some of these symptoms and risks. Talk to your health care provider about whether hormone replacement therapy is right for you.  HOME CARE INSTRUCTIONS   Schedule regular health, dental, and eye exams.  Stay current with your immunizations.   Do not use any tobacco products including cigarettes, chewing tobacco, or electronic cigarettes.  If you are pregnant, do not drink alcohol.  If you are breastfeeding, limit how much and how often you drink alcohol.  Limit alcohol intake to no more than 1 drink per day for nonpregnant women. One drink equals 12 ounces of beer, 5 ounces of wine, or 1 ounces of hard liquor.  Do not  use street drugs.  Do not share needles.  Ask your health care provider for help if you need support or information about quitting drugs.  Tell your health care provider if you often feel depressed.  Tell your health care provider if you have ever been abused or do not feel safe at home. Document Released: 09/05/2010 Document Revised: 07/07/2013 Document Reviewed: 01/22/2013 ExitCare Patient Information 2015 [employer]. This information is not intended to replace advice given to you by your health care provider. Make sure you discuss any questions you have with your health care provider.  

## 2015-11-22 NOTE — Progress Notes (Signed)
    Daisy Campbell 1955/06/09 [id number]        60 y.o.  [id number]  for annual exam.  Several issues noted below.  Past medical history,surgical history, problem list, medications, allergies, family history and social history were all reviewed and documented as reviewed in the EPIC chart.  ROS:  Performed with pertinent positives and negatives included in the history, assessment and plan.   Additional significant findings :  None   Exam: Kennon PortelaKim Gardner assistant Vitals:   11/22/15 0940  BP: 120/76  Weight: 188 lb (85.3 kg)  Height: 5\' 4"  (1.626 m)   Body mass index is 32.27 kg/m.  General appearance:  Normal affect, orientation and appearance. Skin: Grossly normal HEENT: Without gross lesions.  No cervical or supraclavicular adenopathy. Thyroid normal.  Lungs:  Clear without wheezing, rales or rhonchi Cardiac: RR, without RMG Abdominal:  Soft, nontender, without masses, guarding, rebound, organomegaly or hernia Breasts:  Examined lying and sitting without masses, retractions, discharge or axillary adenopathy. Pelvic:  Ext/BUS/Vagina with atrophic changes  Cervix with atrophic changes  Uterus anteverted, normal size, shape and contour, midline and mobile nontender   Adnexa without masses or tenderness    Anus and perineum normal   Rectovaginal normal sphincter tone without palpated masses or tenderness.    Assessment/Plan:  60 y.o. Daisy Campbell female for annual exam.   1. Postmenopausal/atrophic genital changes/HRT. Patient has been on minivelle 0.05 mg patches and Prometrium 100 mg nightly.  Her pharmacy had run out of the minivelle and switched her to a generic patch and she's having a lot of skin irritation. I reviewed the new NAMS 2017 HRT guidelines. Benefits as far as symptom relief possible cardiovascular and bone health support with early initiation and risks to include thrombosis such as stroke heart attack DVT and possible breast cancer all reviewed. Benefits of transdermal  over oral form of thrombosis standpoint discussed. Options to include trying a different patch to using oral estrogen also discussed. Ultimately she is going to try Climara 0.05 mg patches in her Prometrium 100 mg nightly. She'll call if she has any issues with this as she desires to continue HRT for now understanding and accepting the risks. Has done no bleeding and she knows importance to call if she does any bleeding. 2. Mammography 10/2015. Continue with annual mammography when due. SBE monthly reviewed. 3. DEXA 2013 normal. Recommend repeat at age 60. 4. Pap smear 2016. No Pap smear done today. No history of abnormal Pap smears previously. Plan repeat Pap smear at 3 year interval. 5. Colonoscopy due now have reminded her to schedule with Dr. Loreta AveMann where her last colonoscopy was done. 6. Health maintenance. No routine lab work done as patient reports this done elsewhere. Follow up in one year, sooner if any issues with her HRT.  Greater than 10 minutes of my time in excess of her routine gynecologic exam was spent in direct face to face counseling and coordination of care in regards to her issues of HRT and new guideline reviewed.[name].    Philamena Kramar P MD, 10:35 AM 11/22/2015

## 2015-12-17 ENCOUNTER — Other Ambulatory Visit: Payer: Self-pay | Admitting: Gynecology

## 2016-04-07 ENCOUNTER — Telehealth: Payer: Self-pay | Admitting: *Deleted

## 2016-04-07 MED ORDER — ESTRADIOL 0.05 MG/24HR TD PTTW: 1.0000 | MEDICATED_PATCH | TRANSDERMAL | 7 refills | Status: DC

## 2016-04-07 NOTE — Telephone Encounter (Signed)
Okay for Vivelle 0.05 mg patch twice weekly refill through next annual exam. She should continue on her Prometrium 100 mg nightly.

## 2016-04-07 NOTE — Telephone Encounter (Signed)
Pt takes climara patch 0.05 mg states it causes skin irritation itching, redness etc. Pt found out that with the vivelle patch twice weekly patch as a different manufacturer as the climara patch. Pt asked if she could try the twice weekly patch? Please advise

## 2016-04-07 NOTE — Telephone Encounter (Signed)
Pt aware to take Prometrium as well, Rx sent.

## 2016-10-04 ENCOUNTER — Other Ambulatory Visit: Payer: Self-pay | Admitting: Gynecology

## 2016-10-04 DIAGNOSIS — Z1231 Encounter for screening mammogram for malignant neoplasm of breast: Secondary | ICD-10-CM

## 2016-11-10 ENCOUNTER — Ambulatory Visit
Admission: RE | Admit: 2016-11-10 | Discharge: 2016-11-10 | Disposition: A | Discharge status: Home / Self Care | Payer: PRIVATE HEALTH INSURANCE | Source: Ambulatory Visit | Attending: Gynecology | Admitting: Gynecology

## 2016-11-10 DIAGNOSIS — Z1231 Encounter for screening mammogram for malignant neoplasm of breast: Secondary | ICD-10-CM

## 2016-11-22 ENCOUNTER — Ambulatory Visit (INDEPENDENT_AMBULATORY_CARE_PROVIDER_SITE_OTHER): Payer: PRIVATE HEALTH INSURANCE | Admitting: Gynecology

## 2016-11-22 ENCOUNTER — Encounter: Payer: Self-pay | Admitting: Gynecology

## 2016-11-22 VITALS — BP 122/78 | Ht 64.5 in | Wt 195.0 lb

## 2016-11-22 DIAGNOSIS — N952 Postmenopausal atrophic vaginitis: Secondary | ICD-10-CM | POA: Diagnosis not present

## 2016-11-22 DIAGNOSIS — Z79890 Hormone replacement therapy (postmenopausal): Secondary | ICD-10-CM

## 2016-11-22 DIAGNOSIS — Z01411 Encounter for gynecological examination (general) (routine) with abnormal findings: Secondary | ICD-10-CM | POA: Diagnosis not present

## 2016-11-22 MED ORDER — ESTRADIOL 0.05 MG/24HR TD PTTW: 1.0000 | MEDICATED_PATCH | TRANSDERMAL | 4 refills | Status: DC

## 2016-11-22 MED ORDER — PROGESTERONE MICRONIZED 100 MG PO CAPS: ORAL_CAPSULE | ORAL | 4 refills | Status: DC

## 2016-11-22 NOTE — Patient Instructions (Signed)
Schedule your colonoscopy with either:  [facility]   Address: [address], [place], [place] [address]  [phone]    or  [facility]  Address: [address], [place], [place] [address]  [phone]      

## 2016-11-22 NOTE — Progress Notes (Signed)
    Daisy Campbell [birth year] [id number]        61 y.o.  [id number] for annual gynecologic exam.  Doing well without complaints.  Currently on HRT.  Past medical history,surgical history, problem list, medications, allergies, family history and social history were all reviewed and documented as reviewed in the EPIC chart.  ROS:  Performed with pertinent positives and negatives included in the history, assessment and plan.   Additional significant findings :  None   Exam: Kennon Portela assistant Vitals:   11/22/16 0943  BP: 122/78  Weight: 195 lb (88.5 kg)  Height: 5' 4.5" (1.638 m)   Body mass index is 32.95 kg/m.  General appearance:  Normal affect, orientation and appearance. Skin: Grossly normal HEENT: Without gross lesions.  No cervical or supraclavicular adenopathy. Thyroid normal.  Lungs:  Clear without wheezing, rales or rhonchi Cardiac: RR, without RMG Abdominal:  Soft, nontender, without masses, guarding, rebound, organomegaly or hernia Breasts:  Examined lying and sitting without masses, retractions, discharge or axillary adenopathy. Pelvic:  Ext, BUS, Vagina: With atrophic changes  Cervix: With atrophic changes  Uterus: Anteverted, normal size, shape and contour, midline and mobile nontender   Adnexa: Without masses or tenderness    Anus and perineum: Normal   Rectovaginal: Normal sphincter tone without palpated masses or tenderness.    Assessment/Plan:  61 y.o. [id number] female for annual gynecologic exam.   1. Postmenopausal/atrophic genital changes/HRT. Continues on minivelle 0.05 mg patch twice weekly and Prometrium 100 mg nightly. No bleeding. Doing well without symptoms. Again reviewed the whole issue of HRT and when to wean. Risks to include thrombosis such as stroke heart attack DVT and breast cancer issues reviewed. Benefits to include symptom relief cardiovascular and bone health also discussed. At this point the patient wants to continue understanding and  accepting the risks. Refill 1 year provided. Call if any bleeding. 2. Mammography 11/2016. Breast exam normal today. Repeat annual mammography when due. 3. DEXA 2013 normal. Plan follow up DEXA at age 14. 4. Pap smear 11/2014. No Pap smear done today. Recommend follow up Pap smear next year at 3 year interval per current screening guidelines. No history of significant abnormal Pap smears. 5. Colonoscopy 2007. Patient is overdue and she knows to schedule an agrees to call do so. 6. Health maintenance. No routine lab work done as patient does this elsewhere. Follow up 1 year, sooner as needed.   [name] MD, 10:28 AM 11/22/2016

## 2017-05-28 DIAGNOSIS — F321 Major depressive disorder, single episode, moderate: Secondary | ICD-10-CM | POA: Diagnosis not present

## 2017-05-28 DIAGNOSIS — F411 Generalized anxiety disorder: Secondary | ICD-10-CM | POA: Diagnosis not present

## 2017-07-02 DIAGNOSIS — F411 Generalized anxiety disorder: Secondary | ICD-10-CM | POA: Diagnosis not present

## 2017-07-02 DIAGNOSIS — F321 Major depressive disorder, single episode, moderate: Secondary | ICD-10-CM | POA: Diagnosis not present

## 2017-07-12 DIAGNOSIS — H16223 Keratoconjunctivitis sicca, not specified as Sjogren's, bilateral: Secondary | ICD-10-CM | POA: Diagnosis not present

## 2017-07-19 DIAGNOSIS — H04552 Acquired stenosis of left nasolacrimal duct: Secondary | ICD-10-CM | POA: Diagnosis not present

## 2017-08-06 DIAGNOSIS — F411 Generalized anxiety disorder: Secondary | ICD-10-CM | POA: Diagnosis not present

## 2017-08-06 DIAGNOSIS — F321 Major depressive disorder, single episode, moderate: Secondary | ICD-10-CM | POA: Diagnosis not present

## 2017-08-08 DIAGNOSIS — Z01818 Encounter for other preprocedural examination: Secondary | ICD-10-CM | POA: Diagnosis not present

## 2017-08-08 DIAGNOSIS — H04552 Acquired stenosis of left nasolacrimal duct: Secondary | ICD-10-CM | POA: Diagnosis not present

## 2017-08-08 DIAGNOSIS — H04551 Acquired stenosis of right nasolacrimal duct: Secondary | ICD-10-CM | POA: Diagnosis not present

## 2017-08-08 DIAGNOSIS — H5789 Other specified disorders of eye and adnexa: Secondary | ICD-10-CM | POA: Diagnosis not present

## 2017-09-12 DIAGNOSIS — F321 Major depressive disorder, single episode, moderate: Secondary | ICD-10-CM | POA: Diagnosis not present

## 2017-09-12 DIAGNOSIS — F411 Generalized anxiety disorder: Secondary | ICD-10-CM | POA: Diagnosis not present

## 2017-09-26 DIAGNOSIS — F411 Generalized anxiety disorder: Secondary | ICD-10-CM | POA: Diagnosis not present

## 2017-09-26 DIAGNOSIS — F321 Major depressive disorder, single episode, moderate: Secondary | ICD-10-CM | POA: Diagnosis not present

## 2017-11-06 ENCOUNTER — Other Ambulatory Visit: Payer: Self-pay | Admitting: Gynecology

## 2017-11-06 DIAGNOSIS — Z1231 Encounter for screening mammogram for malignant neoplasm of breast: Secondary | ICD-10-CM

## 2017-11-07 DIAGNOSIS — F411 Generalized anxiety disorder: Secondary | ICD-10-CM | POA: Diagnosis not present

## 2017-11-07 DIAGNOSIS — F321 Major depressive disorder, single episode, moderate: Secondary | ICD-10-CM | POA: Diagnosis not present

## 2017-11-12 ENCOUNTER — Ambulatory Visit
Admission: RE | Admit: 2017-11-12 | Discharge: 2017-11-12 | Disposition: A | Discharge status: Home / Self Care | Payer: PRIVATE HEALTH INSURANCE | Source: Ambulatory Visit | Attending: Gynecology | Admitting: Gynecology

## 2017-11-12 DIAGNOSIS — Z1231 Encounter for screening mammogram for malignant neoplasm of breast: Secondary | ICD-10-CM

## 2017-11-26 ENCOUNTER — Ambulatory Visit: Payer: BLUE CROSS/BLUE SHIELD | Admitting: Gynecology

## 2017-11-26 ENCOUNTER — Encounter: Payer: Self-pay | Admitting: Gynecology

## 2017-11-26 VITALS — BP 110/70 | Ht 64.5 in | Wt 187.0 lb

## 2017-11-26 DIAGNOSIS — N952 Postmenopausal atrophic vaginitis: Secondary | ICD-10-CM | POA: Diagnosis not present

## 2017-11-26 DIAGNOSIS — Z79890 Hormone replacement therapy (postmenopausal): Secondary | ICD-10-CM

## 2017-11-26 DIAGNOSIS — Z01419 Encounter for gynecological examination (general) (routine) without abnormal findings: Secondary | ICD-10-CM | POA: Diagnosis not present

## 2017-11-26 MED ORDER — ESTRADIOL 0.05 MG/24HR TD PTTW: 1.0000 | MEDICATED_PATCH | TRANSDERMAL | 4 refills | Status: DC

## 2017-11-26 MED ORDER — PROGESTERONE MICRONIZED 100 MG PO CAPS: ORAL_CAPSULE | ORAL | 4 refills | Status: DC

## 2017-11-26 NOTE — Patient Instructions (Signed)
Schedule your colonoscopy.  Follow-up in 1 year for annual exam, sooner if any issues. 

## 2017-11-26 NOTE — Addendum Note (Signed)
Addended by: Dayna BarkerGARDNER, Ulric Salzman K on: 11/26/2017 10:36 AM   Modules accepted: Orders

## 2017-11-26 NOTE — Progress Notes (Signed)
    Clarise Cruzvy J Meldrum 1955/08/03 [id number]        62 y.o.  [id number] for annual gynecologic exam.  Without gynecologic complaints.  Continues on HRT.  Past medical history,surgical history, problem list, medications, allergies, family history and social history were all reviewed and documented as reviewed in the EPIC chart.  ROS:  Performed with pertinent positives and negatives included in the history, assessment and plan.   Additional significant findings : None   Exam: Kennon PortelaKim Gardner assistant Vitals:   11/26/17 0959  BP: 110/70  Weight: 187 lb (84.8 kg)  Height: 5' 4.5" (1.638 m)   Body mass index is 31.6 kg/m.  General appearance:  Normal affect, orientation and appearance. Skin: Grossly normal HEENT: Without gross lesions.  No cervical or supraclavicular adenopathy. Thyroid normal.  Lungs:  Clear without wheezing, rales or rhonchi Cardiac: RR, without RMG Abdominal:  Soft, nontender, without masses, guarding, rebound, organomegaly or hernia Breasts:  Examined lying and sitting without masses, retractions, discharge or axillary adenopathy. Pelvic:  Ext, BUS, Vagina: With atrophic changes  Cervix: With atrophic changes.  Pap smear done  Uterus: Anteverted, normal size, shape and contour, midline and mobile nontender   Adnexa: Without masses or tenderness    Anus and perineum: Normal   Rectovaginal: Normal sphincter tone without palpated masses or tenderness.    Assessment/Plan:  62 y.o. [name] female for annual gynecologic exam.   1. Postmenopausal/HRT.  Continues on minivelle 0.05 mg patch twice weekly and Prometrium 100 mg nightly.  Doing well with no bleeding.  We discussed the issues as to when to wean in the current guidelines.  Risks to include stroke heart attack DVT in the breast cancer issue versus symptom relief cardiovascular and bone health long-term also discussed.  At this point the patient wants to continue and I refilled her x1 year.  If she decides to wean I am  asked her to do this slowly.  Will call if any bleeding. 2. Mammography this month.  Continue with annual mammography next year.  Breast exam normal today. 3. DEXA 2013 normal.  Plan repeat DEXA next year at age 62. 4. Pap smear 2016.  Pap smear done today.  No history of abnormal Pap smears previously. 5. Colonoscopy overdue with last colonoscopy 2007.  Recommended patient call and schedule when she agrees. 6. Health maintenance.  No routine lab work done as patient does this elsewhere.  Follow-up 1 year, sooner as needed.   Dara Lordsimothy P Fontaine MD, 10:18 AM 11/26/2017

## 2017-11-28 LAB — PAP IG W/ RFLX HPV ASCU

## 2017-12-20 DIAGNOSIS — F321 Major depressive disorder, single episode, moderate: Secondary | ICD-10-CM | POA: Diagnosis not present

## 2017-12-20 DIAGNOSIS — F411 Generalized anxiety disorder: Secondary | ICD-10-CM | POA: Diagnosis not present

## 2018-01-28 DIAGNOSIS — F411 Generalized anxiety disorder: Secondary | ICD-10-CM | POA: Diagnosis not present

## 2018-01-28 DIAGNOSIS — F321 Major depressive disorder, single episode, moderate: Secondary | ICD-10-CM | POA: Diagnosis not present

## 2018-01-30 DIAGNOSIS — I129 Hypertensive chronic kidney disease with stage 1 through stage 4 chronic kidney disease, or unspecified chronic kidney disease: Secondary | ICD-10-CM | POA: Diagnosis not present

## 2018-01-30 DIAGNOSIS — Z23 Encounter for immunization: Secondary | ICD-10-CM | POA: Diagnosis not present

## 2018-01-30 DIAGNOSIS — E785 Hyperlipidemia, unspecified: Secondary | ICD-10-CM | POA: Diagnosis not present

## 2018-01-30 DIAGNOSIS — Z1211 Encounter for screening for malignant neoplasm of colon: Secondary | ICD-10-CM | POA: Diagnosis not present

## 2018-01-30 DIAGNOSIS — M25562 Pain in left knee: Secondary | ICD-10-CM | POA: Diagnosis not present

## 2018-01-30 DIAGNOSIS — N183 Chronic kidney disease, stage 3 (moderate): Secondary | ICD-10-CM | POA: Diagnosis not present

## 2018-02-01 DIAGNOSIS — M25461 Effusion, right knee: Secondary | ICD-10-CM | POA: Diagnosis not present

## 2018-02-04 DIAGNOSIS — M25561 Pain in right knee: Secondary | ICD-10-CM | POA: Diagnosis not present

## 2018-02-04 DIAGNOSIS — M1711 Unilateral primary osteoarthritis, right knee: Secondary | ICD-10-CM | POA: Diagnosis not present

## 2018-02-05 DIAGNOSIS — F411 Generalized anxiety disorder: Secondary | ICD-10-CM | POA: Diagnosis not present

## 2018-02-05 DIAGNOSIS — F321 Major depressive disorder, single episode, moderate: Secondary | ICD-10-CM | POA: Diagnosis not present

## 2018-02-12 DIAGNOSIS — S83271A Complex tear of lateral meniscus, current injury, right knee, initial encounter: Secondary | ICD-10-CM | POA: Diagnosis not present

## 2018-02-12 DIAGNOSIS — M25561 Pain in right knee: Secondary | ICD-10-CM | POA: Diagnosis not present

## 2018-02-12 DIAGNOSIS — M25461 Effusion, right knee: Secondary | ICD-10-CM | POA: Diagnosis not present

## 2018-02-12 DIAGNOSIS — M94261 Chondromalacia, right knee: Secondary | ICD-10-CM | POA: Diagnosis not present

## 2018-02-19 DIAGNOSIS — M25561 Pain in right knee: Secondary | ICD-10-CM | POA: Diagnosis not present

## 2018-02-19 DIAGNOSIS — M1711 Unilateral primary osteoarthritis, right knee: Secondary | ICD-10-CM | POA: Diagnosis not present

## 2018-03-13 DIAGNOSIS — F321 Major depressive disorder, single episode, moderate: Secondary | ICD-10-CM | POA: Diagnosis not present

## 2018-03-13 DIAGNOSIS — F411 Generalized anxiety disorder: Secondary | ICD-10-CM | POA: Diagnosis not present

## 2018-04-09 DIAGNOSIS — M1711 Unilateral primary osteoarthritis, right knee: Secondary | ICD-10-CM | POA: Diagnosis not present

## 2018-04-20 DIAGNOSIS — H109 Unspecified conjunctivitis: Secondary | ICD-10-CM | POA: Diagnosis not present

## 2018-04-20 DIAGNOSIS — J019 Acute sinusitis, unspecified: Secondary | ICD-10-CM | POA: Diagnosis not present

## 2018-04-24 DIAGNOSIS — F321 Major depressive disorder, single episode, moderate: Secondary | ICD-10-CM | POA: Diagnosis not present

## 2018-04-24 DIAGNOSIS — F411 Generalized anxiety disorder: Secondary | ICD-10-CM | POA: Diagnosis not present

## 2018-05-23 DIAGNOSIS — F321 Major depressive disorder, single episode, moderate: Secondary | ICD-10-CM | POA: Diagnosis not present

## 2018-05-23 DIAGNOSIS — F411 Generalized anxiety disorder: Secondary | ICD-10-CM | POA: Diagnosis not present

## 2018-06-20 DIAGNOSIS — F321 Major depressive disorder, single episode, moderate: Secondary | ICD-10-CM | POA: Diagnosis not present

## 2018-06-20 DIAGNOSIS — F411 Generalized anxiety disorder: Secondary | ICD-10-CM | POA: Diagnosis not present

## 2018-07-11 DIAGNOSIS — F321 Major depressive disorder, single episode, moderate: Secondary | ICD-10-CM | POA: Diagnosis not present

## 2018-07-11 DIAGNOSIS — F411 Generalized anxiety disorder: Secondary | ICD-10-CM | POA: Diagnosis not present

## 2018-07-25 DIAGNOSIS — F321 Major depressive disorder, single episode, moderate: Secondary | ICD-10-CM | POA: Diagnosis not present

## 2018-07-25 DIAGNOSIS — F411 Generalized anxiety disorder: Secondary | ICD-10-CM | POA: Diagnosis not present

## 2018-11-27 ENCOUNTER — Other Ambulatory Visit: Payer: Self-pay

## 2018-11-27 ENCOUNTER — Other Ambulatory Visit: Payer: Self-pay | Admitting: Gynecology

## 2018-11-27 DIAGNOSIS — Z1231 Encounter for screening mammogram for malignant neoplasm of breast: Secondary | ICD-10-CM

## 2018-11-28 ENCOUNTER — Encounter: Payer: Self-pay | Admitting: Gynecology

## 2018-11-28 ENCOUNTER — Ambulatory Visit (INDEPENDENT_AMBULATORY_CARE_PROVIDER_SITE_OTHER): Payer: Medicare Other | Admitting: Gynecology

## 2018-11-28 VITALS — BP 122/80 | Ht 64.0 in | Wt 195.0 lb

## 2018-11-28 DIAGNOSIS — N393 Stress incontinence (female) (male): Secondary | ICD-10-CM

## 2018-11-28 DIAGNOSIS — Z01419 Encounter for gynecological examination (general) (routine) without abnormal findings: Secondary | ICD-10-CM | POA: Diagnosis not present

## 2018-11-28 DIAGNOSIS — Z79890 Hormone replacement therapy (postmenopausal): Secondary | ICD-10-CM

## 2018-11-28 DIAGNOSIS — N952 Postmenopausal atrophic vaginitis: Secondary | ICD-10-CM

## 2018-11-28 MED ORDER — ESTRADIOL 0.05 MG/24HR TD PTTW: 1.0000 | MEDICATED_PATCH | TRANSDERMAL | 4 refills | Status: DC

## 2018-11-28 MED ORDER — PROGESTERONE MICRONIZED 100 MG PO CAPS: ORAL_CAPSULE | ORAL | 4 refills | Status: DC

## 2018-11-28 NOTE — Patient Instructions (Signed)
Follow-up for the bone density as scheduled  Schedule your colonoscopy  Follow-up in 1 year for annual exam 

## 2018-11-28 NOTE — Progress Notes (Signed)
    RENAY CRAMMER 14-Dec-1955 [id number]        63 y.o.  [id number] for breast and pelvic exam.  Without gynecologic complaints.  Continues on HRT.  Is having some issues with SUI.  Past medical history,surgical history, problem list, medications, allergies, family history and social history were all reviewed and documented as reviewed in the EPIC chart.  ROS:  Performed with pertinent positives and negatives included in the history, assessment and plan.   Additional significant findings : None   Exam: Wandra Scot assistant Vitals:   11/28/18 0927  BP: 122/80  Weight: 195 lb (88.5 kg)  Height: 5\' 4"  (1.626 m)   Body mass index is 33.47 kg/m.  General appearance:  Normal affect, orientation and appearance. Skin: Grossly normal HEENT: Without gross lesions.  No cervical or supraclavicular adenopathy. Thyroid normal.  Lungs:  Clear without wheezing, rales or rhonchi Cardiac: RR, without RMG Abdominal:  Soft, nontender, without masses, guarding, rebound, organomegaly or hernia Breasts:  Examined lying and sitting without masses, retractions, discharge or axillary adenopathy. Pelvic:  Ext, BUS, Vagina: With atrophic changes  Cervix: With atrophic changes  Uterus: Anteverted, normal size, shape and contour, midline and mobile nontender   Adnexa: Without masses or tenderness    Anus and perineum: Normal   Rectovaginal: Normal sphincter tone without palpated masses or tenderness.    Assessment/Plan:  63 y.o. [id number] female for breast and pelvic exam.  1. Postmenopausal/HRT.  Continues on Minivelle 0.05 mg and Prometrium 100 mg nightly.  No bleeding and overall doing well.  We discussed weaning last year but she decided against this.  We again discussed weaning and how to do this.  We discussed the risks to include thrombosis as well as the breast cancer risk.  At this point the patient wants to continue and I refilled her x1 year.  She will think about weaning after the first of the year.   Will call if any bleeding. 2. Mammography scheduled and she will follow-up for this.  Breast exam normal today. 3. Colonoscopy 2007 and I reminded her she is overdue and she needs to schedule this.  She acknowledges my recommendations. 4. DEXA 2013 normal.  Recommend DEXA now at age 74 and she will schedule. 5. Pap smear 2019.  No Pap smear done today.  No history of abnormal Pap smears.  Options to stop screening per current screening guidelines versus less frequent screening intervals reviewed.  Will readdress on an annual basis. 6. Health maintenance.  No routine lab work done as patient does this elsewhere.  Follow-up 1 year, sooner as needed.   Anastasio Auerbach MD, 9:52 AM 11/28/2018

## 2018-11-29 ENCOUNTER — Ambulatory Visit
Admission: RE | Admit: 2018-11-29 | Discharge: 2018-11-29 | Disposition: A | Discharge status: Home / Self Care | Payer: Medicare Other | Source: Ambulatory Visit | Attending: Gynecology | Admitting: Gynecology

## 2018-11-29 ENCOUNTER — Other Ambulatory Visit: Payer: Self-pay

## 2018-11-29 DIAGNOSIS — Z1231 Encounter for screening mammogram for malignant neoplasm of breast: Secondary | ICD-10-CM

## 2018-11-30 LAB — URINALYSIS, COMPLETE W/RFL CULTURE
Bacteria, UA: NONE SEEN /HPF
Bilirubin Urine: NEGATIVE
Glucose, UA: NEGATIVE
Hgb urine dipstick: NEGATIVE
Hyaline Cast: NONE SEEN /LPF
Ketones, ur: NEGATIVE
Leukocyte Esterase: NEGATIVE
Nitrites, Initial: NEGATIVE
Protein, ur: NEGATIVE
Specific Gravity, Urine: 1.006 (ref 1.001–1.03)
WBC, UA: NONE SEEN /HPF (ref 0–5)
pH: 6.5 (ref 5.0–8.0)

## 2018-11-30 LAB — CULTURE INDICATED

## 2018-11-30 LAB — URINE CULTURE
MICRO NUMBER:: 922912
Result:: NO GROWTH
SPECIMEN QUALITY:: ADEQUATE

## 2018-12-11 ENCOUNTER — Encounter: Payer: Self-pay | Admitting: Gynecology

## 2018-12-17 ENCOUNTER — Other Ambulatory Visit: Payer: Self-pay

## 2018-12-18 ENCOUNTER — Other Ambulatory Visit: Payer: Self-pay | Admitting: Gynecology

## 2018-12-18 ENCOUNTER — Encounter: Payer: Self-pay | Admitting: Gynecology

## 2018-12-18 ENCOUNTER — Ambulatory Visit (INDEPENDENT_AMBULATORY_CARE_PROVIDER_SITE_OTHER): Payer: Medicare Other

## 2018-12-18 DIAGNOSIS — Z01419 Encounter for gynecological examination (general) (routine) without abnormal findings: Secondary | ICD-10-CM

## 2018-12-18 DIAGNOSIS — Z780 Asymptomatic menopausal state: Secondary | ICD-10-CM | POA: Diagnosis not present

## 2019-11-12 ENCOUNTER — Other Ambulatory Visit: Payer: Self-pay | Admitting: Obstetrics and Gynecology

## 2019-11-12 DIAGNOSIS — Z1231 Encounter for screening mammogram for malignant neoplasm of breast: Secondary | ICD-10-CM

## 2019-12-01 ENCOUNTER — Ambulatory Visit
Admission: RE | Admit: 2019-12-01 | Discharge: 2019-12-01 | Disposition: A | Discharge status: Home / Self Care | Payer: Medicare Other | Source: Ambulatory Visit | Attending: Obstetrics and Gynecology | Admitting: Obstetrics and Gynecology

## 2019-12-01 ENCOUNTER — Other Ambulatory Visit: Payer: Self-pay

## 2019-12-01 DIAGNOSIS — Z1231 Encounter for screening mammogram for malignant neoplasm of breast: Secondary | ICD-10-CM

## 2019-12-03 ENCOUNTER — Encounter: Payer: Self-pay | Admitting: Obstetrics and Gynecology

## 2019-12-03 ENCOUNTER — Ambulatory Visit (INDEPENDENT_AMBULATORY_CARE_PROVIDER_SITE_OTHER): Payer: Medicare Other | Admitting: Obstetrics and Gynecology

## 2019-12-03 ENCOUNTER — Other Ambulatory Visit: Payer: Self-pay

## 2019-12-03 ENCOUNTER — Encounter: Payer: Medicare Other | Admitting: Gynecology

## 2019-12-03 VITALS — BP 122/78 | Ht 64.0 in | Wt 191.0 lb

## 2019-12-03 DIAGNOSIS — Z79890 Hormone replacement therapy (postmenopausal): Secondary | ICD-10-CM

## 2019-12-03 DIAGNOSIS — L299 Pruritus, unspecified: Secondary | ICD-10-CM

## 2019-12-03 DIAGNOSIS — Z01419 Encounter for gynecological examination (general) (routine) without abnormal findings: Secondary | ICD-10-CM | POA: Diagnosis not present

## 2019-12-03 MED ORDER — PROGESTERONE MICRONIZED 100 MG PO CAPS: 100.0000 mg | ORAL_CAPSULE | Freq: Every day | ORAL | 3 refills | Status: DC

## 2019-12-03 MED ORDER — ESTRADIOL 0.05 MG/24HR TD PTTW: 1.0000 | MEDICATED_PATCH | TRANSDERMAL | 3 refills | Status: DC

## 2019-12-03 NOTE — Addendum Note (Signed)
Addended by: Theresia Majors D on: 12/03/2019 11:37 AM   Modules accepted: Orders

## 2019-12-03 NOTE — Progress Notes (Signed)
Daisy Campbell 1956-02-20 [id number]  SUBJECTIVE:  64 y.o. [id number] female here for a breast and pelvic exam and Pap smear. She has no gynecologic concerns.  Since cutting her estradiol patch dose in half, she has not had any significant hot flashes, but she just generally feels not quite as well as on the higher dose in addition, she has had a tendency more towards constipation rather than her baseline regular stool habits.  Current Outpatient Medications  Medication Sig Dispense Refill  . atorvastatin (LIPITOR) 10 MG tablet Take 10 mg by mouth daily.    Marland Kitchen buPROPion (WELLBUTRIN XL) 150 MG 24 hr tablet Take 150 mg by mouth daily.    . Cetirizine HCl (ZYRTEC PO) Take by mouth.    . Cholecalciferol (VITAMIN D PO) Take by mouth.    . estradiol (VIVELLE-DOT) 0.05 MG/24HR patch Place 1 patch (0.05 mg total) onto the skin 2 (two) times a week. (Patient taking differently: Place 1 patch onto the skin 2 (two) times a week. 1/2 patch twice weekly) 24 patch 4  . olmesartan-hydrochlorothiazide (BENICAR HCT) 20-12.5 MG per tablet Take 1 tablet by mouth daily.    . progesterone (PROMETRIUM) 100 MG capsule TAKE ONE CAPSULE BY MOUTH EVERY NIGHT AT BEDTIME 90 capsule 4   No current facility-administered medications for this visit.   Allergies: Erythromycin  Patient's last menstrual period was 02/22/2010.  Past medical history,surgical history, problem list, medications, allergies, family history and social history were all reviewed and documented as reviewed in the EPIC chart.  GYN ROS: no abnormal bleeding, pelvic pain or discharge, no breast pain or new or enlarging lumps on self exam.  No dysuria, urinary frequency, pain with urination, cloudy/malodorous urine.   OBJECTIVE:  BP 122/78   Ht 5\' 4"  (1.626 m)   Wt 191 lb (86.6 kg)   LMP 02/22/2010   BMI 32.79 kg/m  The patient appears well, alert, oriented, in no distress.  BREAST EXAM: breasts appear normal, no suspicious masses, no skin or nipple  changes or axillary nodes  PELVIC EXAM: VULVA: normal appearing vulva with atrophic change, no masses, tenderness or lesions, VAGINA: normal appearing vagina with atrophic change, normal color and discharge, no lesions, CERVIX: normal appearing atrophic cervix without discharge or lesions, UTERUS: uterus is normal size, shape, consistency and nontender, ADNEXA: normal adnexa in size, nontender and no masses  Chaperone: 02/24/2010 present during the examination  ASSESSMENT:  64 y.o. 69 here for a breast and pelvic exam  PLAN:   1. Postmenopausal/HRT.  She is doing a half Vivelle dot 0.05 mg patch twice weekly, Prometrium 100 mg nightly.  I recommended either staying at the same dose or trying to wean off completely.  She does not have any significant hot flash symptoms, but does have the symptom changes as noted above.  May or may not be hormonal related.  Will refill her HRT at her current doses.  She is aware of the risks of HRT to include heart attack, stroke, DVT, PE, and the breast cancer issue.  Understanding this she does wish to continue as the hormones to help her general feeling well.  We will work on weaning in the years to come. 2. Pap smear 2019.  No significant history of abnormal Pap smears.  We discussed the 3-year interval and current guidelines indicating Pap smear surveillance can stop beyond age 32.  Will readdress at her next annual visit. 3. Mammogram 11/2019.  Normal breast exam today.  Notes a little  pruritus on her right nipple only in the past 3 months, no obvious skin changes.  Recommend using moisturizer and possibly OTC hydrocortisone cream as needed, notify us if any drastic skin changes or worsening symptoms.  She will continue with annual mammograms. 4. Colonoscopy 2007.  She understands she is overdue and to remind her to schedule a follow-up colonoscopy and she acknowledges the recommendation. 5. DEXA 12/2018.  Normal BMD.  Next DEXA recommended 2025. 6. Health  maintenance.  No labs today as she normally has these completed elsewhere.  Return annually or sooner, prn.  Theresia Majors MD 12/03/19

## 2020-01-03 ENCOUNTER — Ambulatory Visit: Payer: Medicare Other | Attending: Internal Medicine

## 2020-01-03 DIAGNOSIS — Z23 Encounter for immunization: Secondary | ICD-10-CM

## 2020-01-03 NOTE — Progress Notes (Signed)
   Covid-19 Vaccination Clinic  Name:  Daisy Campbell    MRN: [id number] DOB: [birth year]  01/03/2020  Daisy Campbell was observed post Covid-19 immunization for 15 minutes without incident. She was provided with Vaccine Information Sheet and instruction to access the V-Safe system.   Daisy Campbell was instructed to call 911 with any severe reactions post vaccine: Marland Kitchen Difficulty breathing  . Swelling of face and throat  . A fast heartbeat  . A bad rash all over body  . Dizziness and weakness

## 2020-11-29 ENCOUNTER — Other Ambulatory Visit: Payer: Self-pay | Admitting: Obstetrics and Gynecology

## 2020-11-29 DIAGNOSIS — Z1231 Encounter for screening mammogram for malignant neoplasm of breast: Secondary | ICD-10-CM

## 2020-12-02 ENCOUNTER — Other Ambulatory Visit: Payer: Self-pay

## 2020-12-02 ENCOUNTER — Ambulatory Visit
Admission: RE | Admit: 2020-12-02 | Discharge: 2020-12-02 | Disposition: A | Discharge status: Home / Self Care | Payer: Medicare Other | Source: Ambulatory Visit | Attending: Obstetrics and Gynecology | Admitting: Obstetrics and Gynecology

## 2020-12-02 DIAGNOSIS — Z1231 Encounter for screening mammogram for malignant neoplasm of breast: Secondary | ICD-10-CM

## 2020-12-03 ENCOUNTER — Other Ambulatory Visit (HOSPITAL_COMMUNITY)
Admission: RE | Admit: 2020-12-03 | Discharge: 2020-12-03 | Disposition: A | Discharge status: Home / Self Care | Payer: Medicare Other | Source: Ambulatory Visit | Attending: Obstetrics & Gynecology | Admitting: Obstetrics & Gynecology

## 2020-12-03 ENCOUNTER — Encounter: Payer: Medicare Other | Admitting: Obstetrics and Gynecology

## 2020-12-03 ENCOUNTER — Ambulatory Visit (INDEPENDENT_AMBULATORY_CARE_PROVIDER_SITE_OTHER): Payer: Medicare Other | Admitting: Obstetrics & Gynecology

## 2020-12-03 ENCOUNTER — Encounter: Payer: Self-pay | Admitting: Obstetrics & Gynecology

## 2020-12-03 VITALS — BP 116/78 | HR 68 | Resp 16 | Ht 63.5 in | Wt 184.0 lb

## 2020-12-03 DIAGNOSIS — Z6832 Body mass index (BMI) 32.0-32.9, adult: Secondary | ICD-10-CM

## 2020-12-03 DIAGNOSIS — Z01419 Encounter for gynecological examination (general) (routine) without abnormal findings: Secondary | ICD-10-CM | POA: Insufficient documentation

## 2020-12-03 DIAGNOSIS — Z79890 Hormone replacement therapy (postmenopausal): Secondary | ICD-10-CM

## 2020-12-03 DIAGNOSIS — E6609 Other obesity due to excess calories: Secondary | ICD-10-CM

## 2020-12-03 MED ORDER — ESTRADIOL 0.025 MG/24HR TD PTTW: 1.0000 | MEDICATED_PATCH | TRANSDERMAL | 4 refills | Status: DC

## 2020-12-03 MED ORDER — PROGESTERONE MICRONIZED 100 MG PO CAPS: 100.0000 mg | ORAL_CAPSULE | Freq: Every day | ORAL | 4 refills | Status: DC

## 2020-12-03 NOTE — Progress Notes (Signed)
Daisy Campbell 1955/08/11 [id number]   History:    65 y.o. G4P4L4 Married.  Taking care of her mom.  RP:  Established patient presenting for annual gyn exam   HPI: Postmenopausal well on HRT with Estradiol 0.05 1/2 patch twice a week and Prometrium 100 mg HS.  No PMB.  No menopausal Sx.  No increased risk of stroke except age and no first degree relative with Breast Ca.  No pelvic pain.  No pain with IC. Pap smear 2019.  No significant history of abnormal Pap smears.  Breasts normal.  Mammogram Neg 11/2020.  Overdue for [employer], will schedule now.  BMI 32.08.  Enjoys yard work.  Health labs with Fam MD.  BMI Normal breast exam today. DEXA 12/2018 was normal BMD.  Next DEXA recommended 2025.     Past medical history,surgical history, family history and social history were all reviewed and documented in the EPIC chart.  Gynecologic History Patient's last menstrual period was 02/22/2010.  Obstetric History OB History  Gravida Para Term Preterm AB Living  4 4 4     4   SAB IAB Ectopic Multiple Live Births               # Outcome Date GA Lbr Len/2nd Weight Sex Delivery Anes PTL Lv  4 Term           3 Term           2 Term           1 Term              ROS: A ROS was performed and pertinent positives and negatives are included in the history.  GENERAL: No fevers or chills. HEENT: No change in vision, no earache, sore throat or sinus congestion. NECK: No pain or stiffness. CARDIOVASCULAR: No chest pain or pressure. No palpitations. PULMONARY: No shortness of breath, cough or wheeze. GASTROINTESTINAL: No abdominal pain, nausea, vomiting or diarrhea, melena or bright red blood per rectum. GENITOURINARY: No urinary frequency, urgency, hesitancy or dysuria. MUSCULOSKELETAL: No joint or muscle pain, no back pain, no recent trauma. DERMATOLOGIC: No rash, no itching, no lesions. ENDOCRINE: No polyuria, polydipsia, no heat or cold intolerance. No recent change in weight. HEMATOLOGICAL: No anemia or  easy bruising or bleeding. NEUROLOGIC: No headache, seizures, numbness, tingling or weakness. PSYCHIATRIC: No depression, no loss of interest in normal activity or change in sleep pattern.     Exam:   BP 116/78   Pulse 68   Resp 16   Ht 5' 3.5" (1.613 m)   Wt 184 lb (83.5 kg)   LMP 02/22/2010   BMI 32.08 kg/m   Body mass index is 32.08 kg/m.  General appearance : Well developed well nourished female. No acute distress HEENT: Eyes: no retinal hemorrhage or exudates,  Neck supple, trachea midline, no carotid bruits, no thyroidmegaly Lungs: Clear to auscultation, no rhonchi or wheezes, or rib retractions  Heart: Regular rate and rhythm, no murmurs or gallops Breast:Examined in sitting and supine position were symmetrical in appearance, no palpable masses or tenderness,  no skin retraction, no nipple inversion, no nipple discharge, no skin discoloration, no axillary or supraclavicular lymphadenopathy Abdomen: no palpable masses or tenderness, no rebound or guarding Extremities: no edema or skin discoloration or tenderness  Pelvic: Vulva: Normal             Vagina: No gross lesions or discharge  Cervix: No gross lesions or discharge.  Pap reflex done.  Uterus  AV, normal size, shape and consistency, non-tender and mobile  Adnexa  Without masses or tenderness  Anus: Normal   Assessment/Plan:  65 y.o. female for annual exam   1. Encounter for routine gynecological examination with Papanicolaou smear of cervix Normal gynecologic exam.  Pap reflex done.  Breast exam normal.  Screening mammogram September 2022 was negative.  We will schedule a colonoscopy as soon as possible.  Health labs with family physician. - Cytology - PAP( [facility])  2. Postmenopausal hormone replacement therapy We will start weaning hormone replacement therapy.  Will use estradiol patch 0.02 5/2 a patch twice weekly from now 1.  Continue Prometrium at the same dosage with 100 mg per mouth at bedtime.   Prescription sent to pharmacy.  Bone density normal in 2020, will repeat in 2025.  3. Class 1 obesity due to excess calories without serious comorbidity with body mass index (BMI) of 32.0 to 32.9 in adult Recommend a lower calorie/carb diet.  Aerobic activities 5 times a week and light weightlifting every 2 days.  Other orders - Multiple Vitamin (MULTIVITAMIN PO); Take by mouth. - progesterone (PROMETRIUM) 100 MG capsule; Take 1 capsule (100 mg total) by mouth at bedtime. - estradiol (VIVELLE-DOT) 0.025 MG/24HR; Place 1 patch onto the skin 2 (two) times a week.   Genia Del MD, 9:19 AM 12/03/2020

## 2020-12-06 LAB — CYTOLOGY - PAP: Diagnosis: NEGATIVE

## 2021-11-04 ENCOUNTER — Other Ambulatory Visit: Payer: Self-pay | Admitting: Obstetrics & Gynecology

## 2021-11-04 DIAGNOSIS — Z1231 Encounter for screening mammogram for malignant neoplasm of breast: Secondary | ICD-10-CM

## 2021-12-05 ENCOUNTER — Ambulatory Visit
Admission: RE | Admit: 2021-12-05 | Discharge: 2021-12-05 | Disposition: A | Discharge status: Home / Self Care | Payer: Medicare Other | Source: Ambulatory Visit | Attending: Obstetrics & Gynecology | Admitting: Obstetrics & Gynecology

## 2021-12-05 DIAGNOSIS — Z1231 Encounter for screening mammogram for malignant neoplasm of breast: Secondary | ICD-10-CM

## 2021-12-07 ENCOUNTER — Other Ambulatory Visit: Payer: Self-pay | Admitting: Obstetrics & Gynecology

## 2021-12-07 DIAGNOSIS — R928 Other abnormal and inconclusive findings on diagnostic imaging of breast: Secondary | ICD-10-CM

## 2021-12-19 ENCOUNTER — Ambulatory Visit
Admission: RE | Admit: 2021-12-19 | Discharge: 2021-12-19 | Disposition: A | Discharge status: Home / Self Care | Payer: Medicare Other | Source: Ambulatory Visit | Attending: Obstetrics & Gynecology | Admitting: Obstetrics & Gynecology

## 2021-12-19 ENCOUNTER — Other Ambulatory Visit: Payer: Self-pay | Admitting: Obstetrics & Gynecology

## 2021-12-19 DIAGNOSIS — R928 Other abnormal and inconclusive findings on diagnostic imaging of breast: Secondary | ICD-10-CM

## 2021-12-19 DIAGNOSIS — N6310 Unspecified lump in the right breast, unspecified quadrant: Secondary | ICD-10-CM

## 2021-12-23 ENCOUNTER — Ambulatory Visit
Admission: RE | Admit: 2021-12-23 | Discharge: 2021-12-23 | Disposition: A | Discharge status: Home / Self Care | Payer: Medicare Other | Source: Ambulatory Visit | Attending: Obstetrics & Gynecology | Admitting: Obstetrics & Gynecology

## 2021-12-23 DIAGNOSIS — N6310 Unspecified lump in the right breast, unspecified quadrant: Secondary | ICD-10-CM

## 2022-03-16 IMAGING — MG MM DIGITAL SCREENING BILAT W/ TOMO AND CAD
8 series · 9 of 24 positions shown · non-contrast
Comparison: Previous exam(s).

CLINICAL DATA: Screening.

EXAM:
DIGITAL SCREENING BILATERAL MAMMOGRAM WITH TOMOSYNTHESIS AND CAD
TECHNIQUE: Bilateral screening digital craniocaudal and mediolateral oblique
mammograms were obtained. Bilateral screening digital breast
tomosynthesis was performed. The images were evaluated with
computer-aided detection.

[L CC synth-2D]
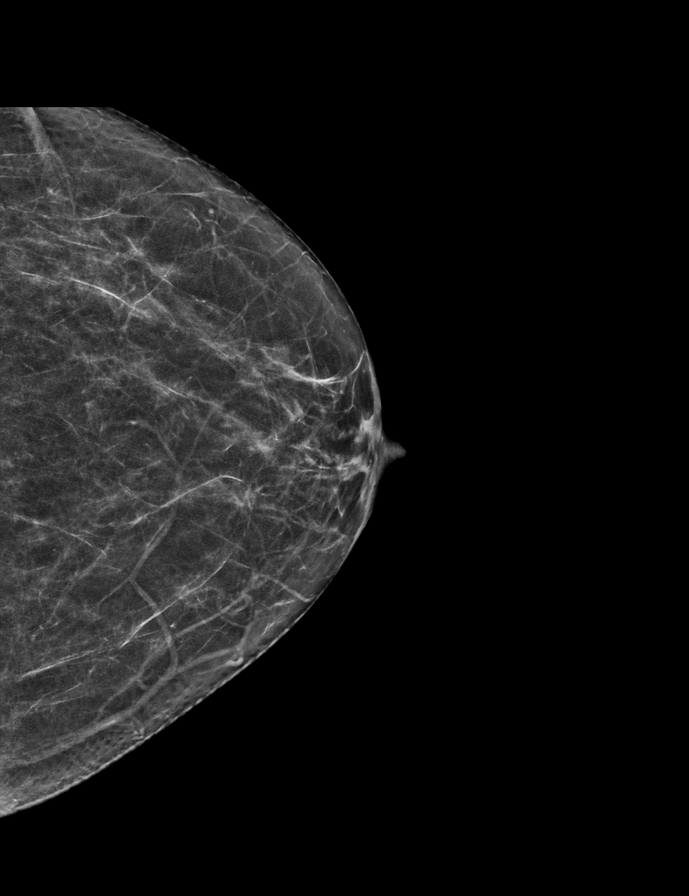

[R MLO synth-2D]
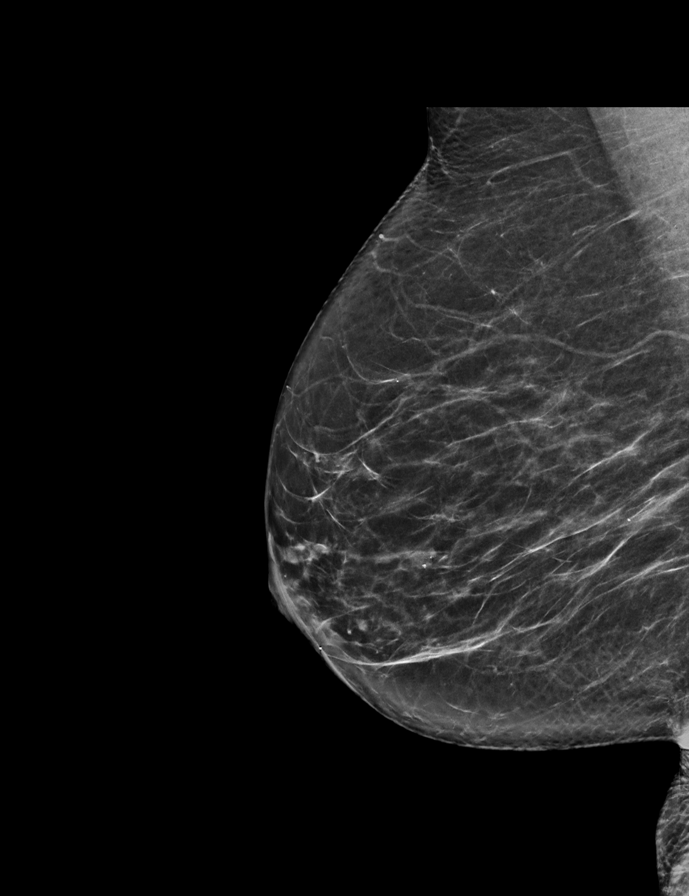

[R CC synth-2D]
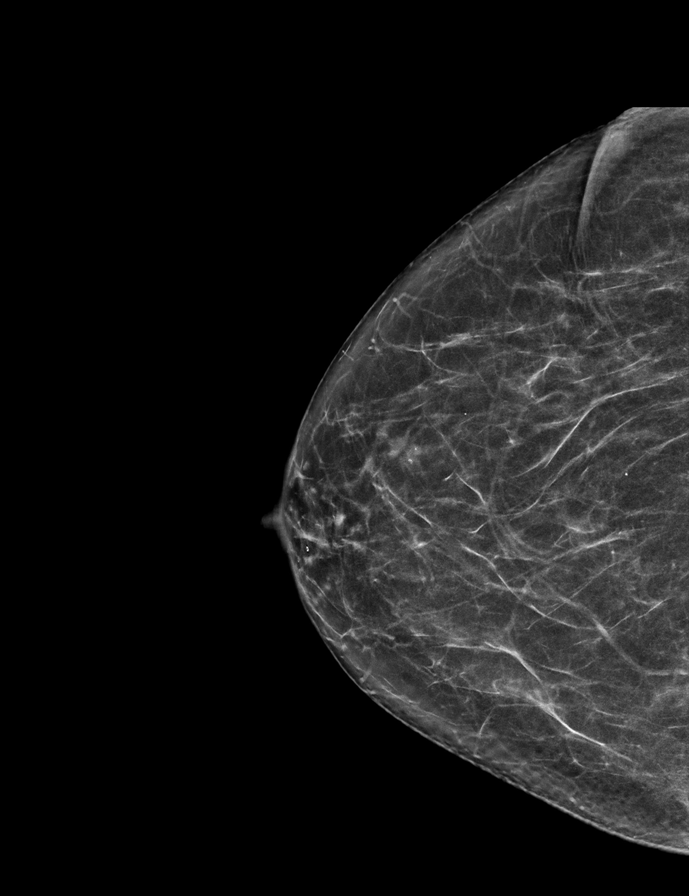

[L MLO synth-2D]
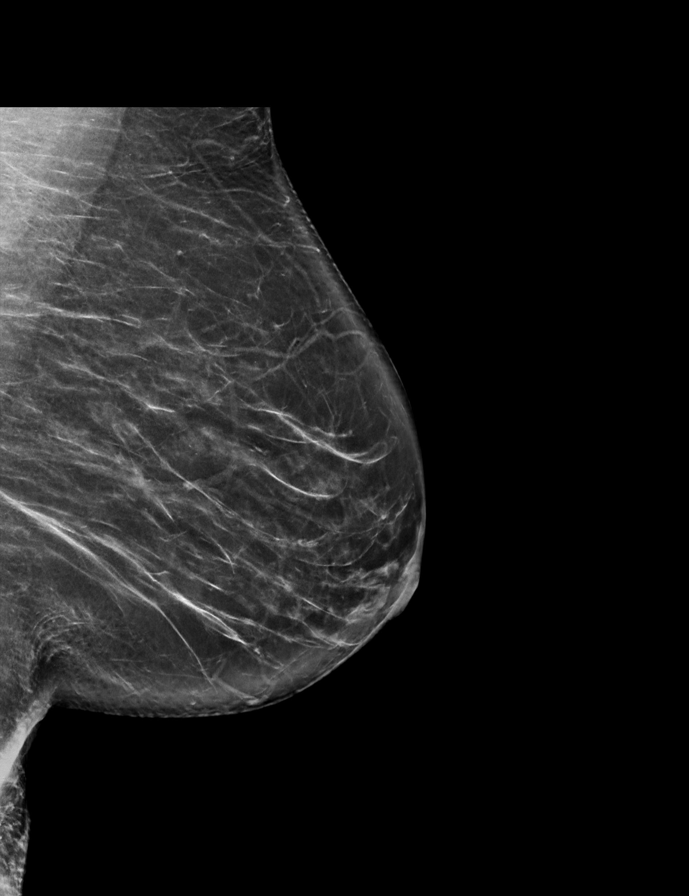

[L CC tomo · 2 of 55 frames shown]
[frame 18/55]
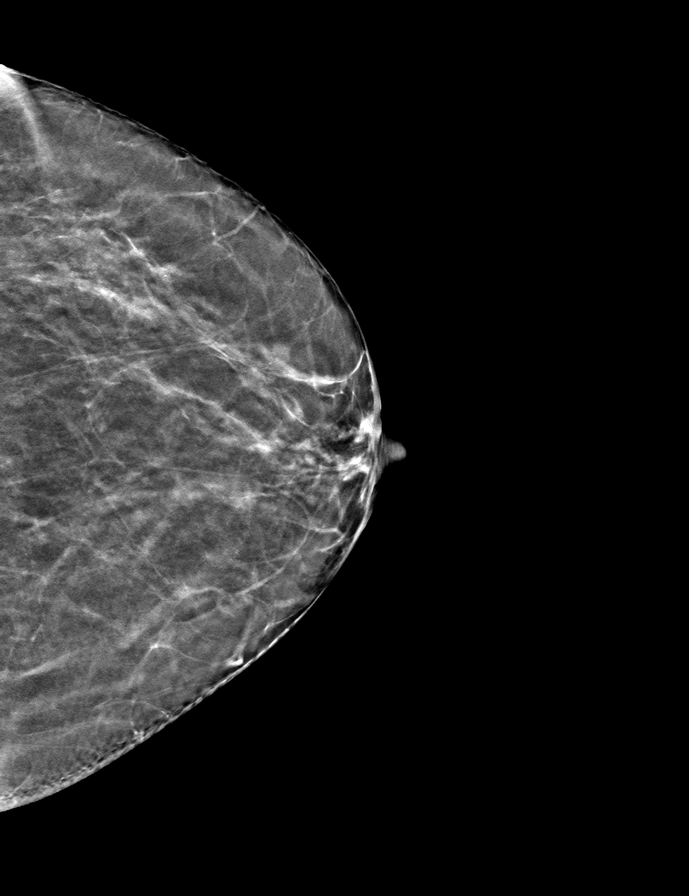
[frame 28/55]
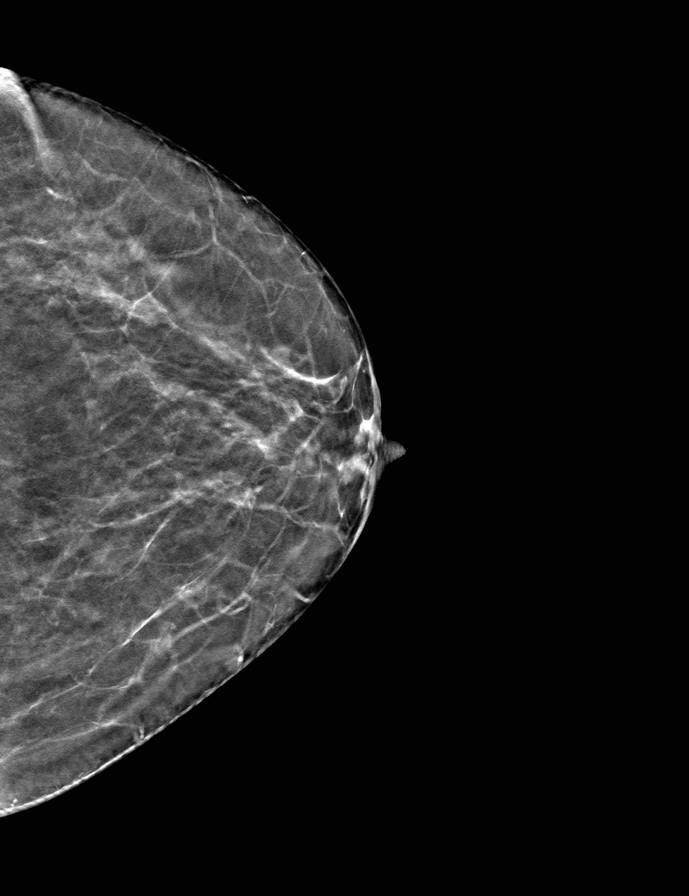

[L MLO tomo · tomo slice 37/73.0]
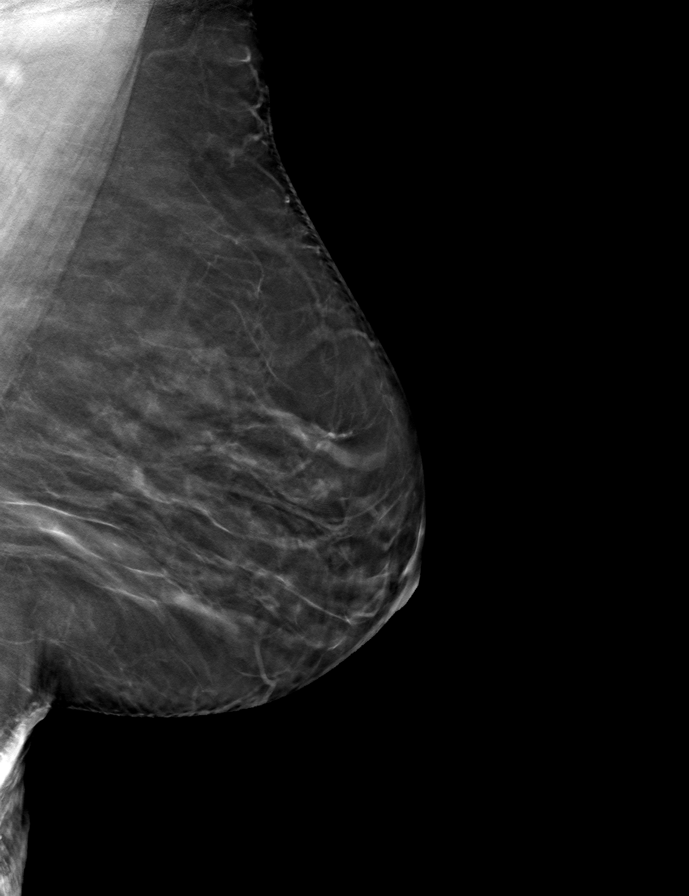

[R MLO tomo · tomo slice 35/68.0]
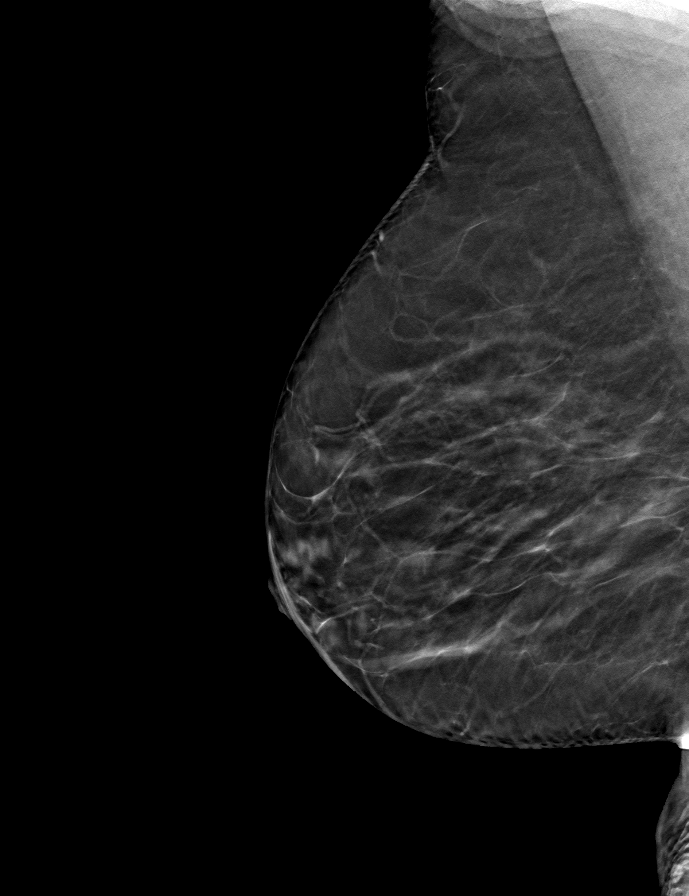

[R CC tomo · tomo slice 30/59.0]
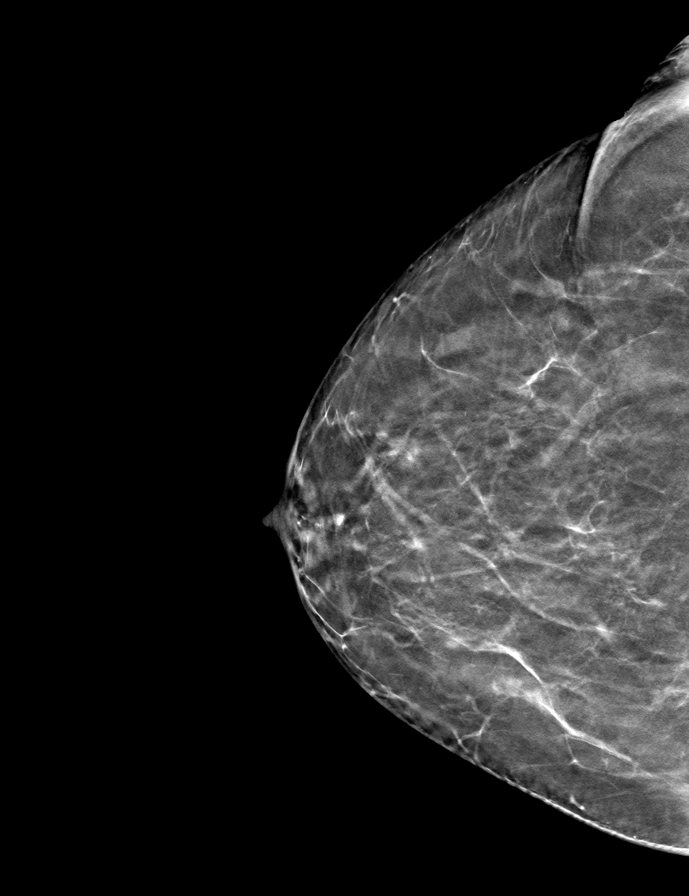

[9 of 24 positions shown; findings below may reference images not displayed]

ACR Breast Density Category b: There are scattered areas of
fibroglandular density.
FINDINGS: There are no findings suspicious for malignancy.
IMPRESSION: No mammographic evidence of malignancy. A result letter of this
screening mammogram will be mailed directly to the patient.

RECOMMENDATION:
Screening mammogram in one year. (Code:51-O-LD2)

BI-RADS CATEGORY  1: Negative.

## 2022-11-27 ENCOUNTER — Other Ambulatory Visit: Payer: Self-pay | Admitting: Family Medicine

## 2022-11-27 DIAGNOSIS — Z1231 Encounter for screening mammogram for malignant neoplasm of breast: Secondary | ICD-10-CM

## 2022-12-07 ENCOUNTER — Ambulatory Visit
Admission: RE | Admit: 2022-12-07 | Discharge: 2022-12-07 | Disposition: A | Discharge status: Home / Self Care | Payer: Medicare Other | Source: Ambulatory Visit | Attending: Family Medicine | Admitting: Family Medicine

## 2022-12-07 DIAGNOSIS — Z1231 Encounter for screening mammogram for malignant neoplasm of breast: Secondary | ICD-10-CM

## 2023-01-18 ENCOUNTER — Encounter: Payer: Medicare Other | Admitting: Obstetrics and Gynecology

## 2023-01-18 ENCOUNTER — Ambulatory Visit (INDEPENDENT_AMBULATORY_CARE_PROVIDER_SITE_OTHER): Payer: Medicare Other | Admitting: Radiology

## 2023-01-18 ENCOUNTER — Encounter: Payer: Self-pay | Admitting: Radiology

## 2023-01-18 VITALS — BP 122/78 | Ht 63.0 in | Wt 184.0 lb

## 2023-01-18 DIAGNOSIS — N952 Postmenopausal atrophic vaginitis: Secondary | ICD-10-CM

## 2023-01-18 DIAGNOSIS — Z01419 Encounter for gynecological examination (general) (routine) without abnormal findings: Secondary | ICD-10-CM

## 2023-01-18 NOTE — Progress Notes (Signed)
   Daisy Campbell 03-28-1955 [id number]   History: Postmenopausal 67 y.o. presents for annual exam. Considering going back on HRT. Does not feel as good since stopping, less energy, skin changes.  Gynecologic History Postmenopausal Last Pap: 2022. Results were: normal Last mammogram: 2024. Results were: normal DEXA:2020 HRT use: previous  Obstetric History OB History  Gravida Para Term Preterm AB Living  4 4 4     4   SAB IAB Ectopic Multiple Live Births               # Outcome Date GA Lbr Len/2nd Weight Sex Type Anes PTL Lv  4 Term           3 Term           2 Term           1 Term              The following portions of the patient's history were reviewed and updated as appropriate: allergies, current medications, past family history, past medical history, past social history, past surgical history, and problem list.  Review of Systems Pertinent items noted in HPI and remainder of comprehensive ROS otherwise negative.  Past medical history, past surgical history, family history and social history were all reviewed and documented in the EPIC chart.  Exam:  Vitals:   01/18/23 0907  BP: 122/78  Weight: 184 lb (83.5 kg)  Height: 5\' 3"  (1.6 m)   Body mass index is 32.59 kg/m.  General appearance:  Normal Thyroid:  Symmetrical, normal in size, without palpable masses or nodularity. Respiratory  Auscultation:  Clear without wheezing or rhonchi Cardiovascular  Auscultation:  Regular rate, without rubs, murmurs or gallops  Edema/varicosities:  Not grossly evident Abdominal  Soft,nontender, without masses, guarding or rebound.  Liver/spleen:  No organomegaly noted  Hernia:  None appreciated  Skin  Inspection:  Grossly normal Breasts: Examined lying and sitting.   Right: Without masses, retractions, nipple discharge or axillary adenopathy.   Left: Without masses, retractions, nipple discharge or axillary adenopathy. Genitourinary   Inguinal/mons:  Normal without  inguinal adenopathy  External genitalia:  Normal appearing vulva with no masses, tenderness, or lesions  BUS/Urethra/Skene's glands:  Normal  Vagina:  Normal appearing with normal color and discharge, no lesions. Atrophy: moderate   Cervix:  Normal appearing without discharge or lesions  Uterus:  Normal in size, shape and contour.  Midline and mobile, nontender  Adnexa/parametria:     Rt: Normal in size, without masses or tenderness.   Lt: Normal in size, without masses or tenderness.  Anus and perineum: Normal    Raynelle Fanning, CMA present for exam  Assessment/Plan:   1. Encounter for breast and pelvic examination Pap 2025 DEXA 2025 Labs with PCP Mammo yearly Will let us know if she would like to restart HRT    Discussed SBE, colonoscopy and DEXA screening as directed. Recommend of exercise weekly, including weight bearing exercise. Encouraged the use of sunscreen.  Return in 2 years for annual or sooner prn.  Tanda Rockers WHNP-BC, 9:27 AM 01/18/2023

## 2023-01-18 NOTE — Patient Instructions (Signed)
Preventive Care 40-69 Years Old, Female Preventive care refers to lifestyle choices and visits with your health care provider that can promote health and wellness. Preventive care visits are also called wellness exams. What can I expect for my preventive care visit? Counseling Your health care provider may ask you questions about your: Medical history, including: Past medical problems. Family medical history. Pregnancy history. Current health, including: Menstrual cycle. Method of birth control. Emotional well-being. Home life and relationship well-being. Sexual activity and sexual health. Lifestyle, including: Alcohol, nicotine or tobacco, and drug use. Access to firearms. Diet, exercise, and sleep habits. Work and work environment. Sunscreen use. Safety issues such as seatbelt and bike helmet use. Physical exam Your health care provider will check your: Height and weight. These may be used to calculate your BMI (body mass index). BMI is a measurement that tells if you are at a healthy weight. Waist circumference. This measures the distance around your waistline. This measurement also tells if you are at a healthy weight and may help predict your risk of certain diseases, such as type 2 diabetes and high blood pressure. Heart rate and blood pressure. Body temperature. Skin for abnormal spots. What immunizations do I need?  Vaccines are usually given at various ages, according to a schedule. Your health care provider will recommend vaccines for you based on your age, medical history, and lifestyle or other factors, such as travel or where you work. What tests do I need? Screening Your health care provider may recommend screening tests for certain conditions. This may include: Lipid and cholesterol levels. Diabetes screening. This is done by checking your blood sugar (glucose) after you have not eaten for a while (fasting). Pelvic exam and Pap test. Hepatitis B test. Hepatitis C  test. HIV (human immunodeficiency virus) test. STI (sexually transmitted infection) testing, if you are at risk. Lung cancer screening. Colorectal cancer screening. Mammogram. Talk with your health care provider about when you should start having regular mammograms. This may depend on whether you have a family history of breast cancer. BRCA-related cancer screening. This may be done if you have a family history of breast, ovarian, tubal, or peritoneal cancers. Bone density scan. This is done to screen for osteoporosis. Talk with your health care provider about your test results, treatment options, and if necessary, the need for more tests. Follow these instructions at home: Eating and drinking  Eat a diet that includes fresh fruits and vegetables, whole grains, lean protein, and low-fat dairy products. Take vitamin and mineral supplements as recommended by your health care provider. Do not drink alcohol if: Your health care provider tells you not to drink. You are pregnant, may be pregnant, or are planning to become pregnant. If you drink alcohol: Limit how much you have to 0-1 drink a day. Know how much alcohol is in your drink. In the [place] one drink equals one 12 oz bottle of beer (355 mL), one 5 oz glass of wine (148 mL), or one 1 oz glass of hard liquor (44 mL). Lifestyle Brush your teeth every morning and night with fluoride toothpaste. Floss one time each day. Exercise for at least 30 minutes 5 or more days each week. Do not use any products that contain nicotine or tobacco. These products include cigarettes, chewing tobacco, and vaping devices, such as e-cigarettes. If you need help quitting, ask your health care provider. Do not use drugs. If you are sexually active, practice safe sex. Use a condom or other form of protection to   prevent STIs. If you do not wish to become pregnant, use a form of birth control. If you plan to become pregnant, see your health care provider for a  prepregnancy visit. Take aspirin only as told by your health care provider. Make sure that you understand how much to take and what form to take. Work with your health care provider to find out whether it is safe and beneficial for you to take aspirin daily. Find healthy ways to manage stress, such as: Meditation, yoga, or listening to music. Journaling. Talking to a trusted person. Spending time with friends and family. Minimize exposure to UV radiation to reduce your risk of skin cancer. Safety Always wear your seat belt while driving or riding in a vehicle. Do not drive: If you have been drinking alcohol. Do not ride with someone who has been drinking. When you are tired or distracted. While texting. If you have been using any mind-altering substances or drugs. Wear a helmet and other protective equipment during sports activities. If you have firearms in your house, make sure you follow all gun safety procedures. Seek help if you have been physically or sexually abused. What's next? Visit your health care provider once a year for an annual wellness visit. Ask your health care provider how often you should have your eyes and teeth checked. Stay up to date on all vaccines. This information is not intended to replace advice given to you by your health care provider. Make sure you discuss any questions you have with your health care provider. Document Revised: 08/18/2020 Document Reviewed: 08/18/2020 Elsevier Patient Education  2024 [employer]  

## 2023-04-30 ENCOUNTER — Other Ambulatory Visit: Payer: Self-pay | Admitting: Family Medicine

## 2023-04-30 DIAGNOSIS — E2839 Other primary ovarian failure: Secondary | ICD-10-CM

## 2023-10-10 ENCOUNTER — Ambulatory Visit

## 2023-10-10 DIAGNOSIS — E2839 Other primary ovarian failure: Secondary | ICD-10-CM | POA: Diagnosis not present

## 2023-11-21 ENCOUNTER — Other Ambulatory Visit: Payer: Self-pay | Admitting: Family Medicine

## 2023-11-21 DIAGNOSIS — Z1231 Encounter for screening mammogram for malignant neoplasm of breast: Secondary | ICD-10-CM

## 2023-12-11 ENCOUNTER — Ambulatory Visit
Admission: RE | Admit: 2023-12-11 | Discharge: 2023-12-11 | Disposition: A | Source: Ambulatory Visit | Attending: Family Medicine | Admitting: Family Medicine

## 2023-12-11 DIAGNOSIS — Z1231 Encounter for screening mammogram for malignant neoplasm of breast: Secondary | ICD-10-CM

## 2023-12-27 ENCOUNTER — Other Ambulatory Visit: Payer: Medicare Other
